# Patient Record
Sex: Female | Born: 1960 | Race: Black or African American | Hispanic: No | Smoking: Former smoker
Health system: Southern US, Community
[De-identification: ages and names within clinical notes are randomized; demographics above are authoritative.]

## PROBLEM LIST (undated history)

## (undated) DIAGNOSIS — I10 Essential (primary) hypertension: Secondary | ICD-10-CM

## (undated) DIAGNOSIS — M1990 Unspecified osteoarthritis, unspecified site: Secondary | ICD-10-CM

## (undated) DIAGNOSIS — T7840XA Allergy, unspecified, initial encounter: Secondary | ICD-10-CM

## (undated) HISTORY — DX: Unspecified osteoarthritis, unspecified site: M19.90

## (undated) HISTORY — PX: ABDOMINAL HYSTERECTOMY: SHX81

## (undated) HISTORY — DX: Allergy, unspecified, initial encounter: T78.40XA

---

## 2004-01-30 ENCOUNTER — Emergency Department (HOSPITAL_COMMUNITY): Admission: EM | Admit: 2004-01-30 | Discharge: 2004-01-30 | Payer: Self-pay | Admitting: Emergency Medicine

## 2004-02-06 ENCOUNTER — Ambulatory Visit: Payer: Self-pay | Admitting: Internal Medicine

## 2004-02-07 ENCOUNTER — Encounter: Admission: RE | Admit: 2004-02-07 | Discharge: 2004-02-07 | Payer: Self-pay | Admitting: Internal Medicine

## 2004-02-22 ENCOUNTER — Encounter: Admission: RE | Admit: 2004-02-22 | Discharge: 2004-03-08 | Payer: Self-pay | Admitting: Internal Medicine

## 2005-02-23 ENCOUNTER — Emergency Department (HOSPITAL_COMMUNITY): Admission: EM | Admit: 2005-02-23 | Discharge: 2005-02-23 | Payer: Self-pay | Admitting: Family Medicine

## 2005-03-01 ENCOUNTER — Emergency Department (HOSPITAL_COMMUNITY): Admission: EM | Admit: 2005-03-01 | Discharge: 2005-03-01 | Payer: Self-pay | Admitting: Family Medicine

## 2005-10-25 ENCOUNTER — Emergency Department (HOSPITAL_COMMUNITY): Admission: EM | Admit: 2005-10-25 | Discharge: 2005-10-25 | Payer: Self-pay | Admitting: Emergency Medicine

## 2005-11-08 ENCOUNTER — Ambulatory Visit: Payer: Self-pay | Admitting: Family Medicine

## 2005-11-29 ENCOUNTER — Encounter: Admission: RE | Admit: 2005-11-29 | Discharge: 2005-11-29 | Payer: Self-pay | Admitting: Sports Medicine

## 2005-12-09 ENCOUNTER — Ambulatory Visit: Payer: Self-pay | Admitting: Family Medicine

## 2006-02-20 ENCOUNTER — Ambulatory Visit: Payer: Self-pay | Admitting: Family Medicine

## 2006-04-10 ENCOUNTER — Ambulatory Visit: Payer: Self-pay | Admitting: Family Medicine

## 2006-04-17 DIAGNOSIS — F329 Major depressive disorder, single episode, unspecified: Secondary | ICD-10-CM

## 2006-04-17 DIAGNOSIS — I10 Essential (primary) hypertension: Secondary | ICD-10-CM

## 2006-04-17 DIAGNOSIS — F3289 Other specified depressive episodes: Secondary | ICD-10-CM | POA: Insufficient documentation

## 2006-04-17 DIAGNOSIS — M159 Polyosteoarthritis, unspecified: Secondary | ICD-10-CM | POA: Insufficient documentation

## 2006-04-17 DIAGNOSIS — E7800 Pure hypercholesterolemia, unspecified: Secondary | ICD-10-CM

## 2006-05-05 ENCOUNTER — Telehealth: Payer: Self-pay | Admitting: *Deleted

## 2006-06-13 ENCOUNTER — Telehealth (INDEPENDENT_AMBULATORY_CARE_PROVIDER_SITE_OTHER): Payer: Self-pay | Admitting: Family Medicine

## 2006-06-18 ENCOUNTER — Telehealth: Payer: Self-pay | Admitting: *Deleted

## 2006-06-27 ENCOUNTER — Encounter (INDEPENDENT_AMBULATORY_CARE_PROVIDER_SITE_OTHER): Payer: Self-pay | Admitting: Family Medicine

## 2006-06-27 ENCOUNTER — Ambulatory Visit: Payer: Self-pay | Admitting: Family Medicine

## 2006-06-27 DIAGNOSIS — M5412 Radiculopathy, cervical region: Secondary | ICD-10-CM | POA: Insufficient documentation

## 2006-06-27 LAB — CONVERTED CEMR LAB
BUN: 15 mg/dL (ref 6–23)
CO2: 25 meq/L (ref 19–32)
Calcium: 9.7 mg/dL (ref 8.4–10.5)
Chloride: 101 meq/L (ref 96–112)
Creatinine, Ser: 1.03 mg/dL (ref 0.40–1.20)
Glucose, Bld: 95 mg/dL (ref 70–99)
HDL goal, serum: 40 mg/dL
LDL Goal: 160 mg/dL
Potassium: 3.8 meq/L (ref 3.5–5.3)
Sodium: 137 meq/L (ref 135–145)

## 2006-07-08 ENCOUNTER — Telehealth (INDEPENDENT_AMBULATORY_CARE_PROVIDER_SITE_OTHER): Payer: Self-pay | Admitting: Family Medicine

## 2006-07-17 ENCOUNTER — Encounter (INDEPENDENT_AMBULATORY_CARE_PROVIDER_SITE_OTHER): Payer: Self-pay | Admitting: Family Medicine

## 2006-07-17 ENCOUNTER — Ambulatory Visit: Payer: Self-pay | Admitting: Family Medicine

## 2006-07-20 LAB — CONVERTED CEMR LAB: Direct LDL: 158 mg/dL — ABNORMAL HIGH

## 2006-07-30 ENCOUNTER — Ambulatory Visit: Payer: Self-pay | Admitting: Family Medicine

## 2006-07-30 DIAGNOSIS — G894 Chronic pain syndrome: Secondary | ICD-10-CM | POA: Insufficient documentation

## 2006-10-06 ENCOUNTER — Telehealth (INDEPENDENT_AMBULATORY_CARE_PROVIDER_SITE_OTHER): Payer: Self-pay | Admitting: *Deleted

## 2006-10-07 ENCOUNTER — Ambulatory Visit: Payer: Self-pay | Admitting: Family Medicine

## 2006-10-07 DIAGNOSIS — J309 Allergic rhinitis, unspecified: Secondary | ICD-10-CM | POA: Insufficient documentation

## 2006-10-28 ENCOUNTER — Telehealth (INDEPENDENT_AMBULATORY_CARE_PROVIDER_SITE_OTHER): Payer: Self-pay | Admitting: Family Medicine

## 2006-11-11 ENCOUNTER — Emergency Department (HOSPITAL_COMMUNITY): Admission: EM | Admit: 2006-11-11 | Discharge: 2006-11-11 | Payer: Self-pay | Admitting: Emergency Medicine

## 2006-11-24 ENCOUNTER — Encounter (INDEPENDENT_AMBULATORY_CARE_PROVIDER_SITE_OTHER): Payer: Self-pay | Admitting: Family Medicine

## 2006-11-24 ENCOUNTER — Ambulatory Visit: Payer: Self-pay | Admitting: Family Medicine

## 2006-11-24 DIAGNOSIS — E669 Obesity, unspecified: Secondary | ICD-10-CM

## 2006-11-24 DIAGNOSIS — N951 Menopausal and female climacteric states: Secondary | ICD-10-CM

## 2006-11-24 LAB — CONVERTED CEMR LAB
BUN: 15 mg/dL (ref 6–23)
CO2: 20 meq/L (ref 19–32)
Calcium: 9.4 mg/dL (ref 8.4–10.5)
Direct LDL: 142 mg/dL — ABNORMAL HIGH
Glucose, Bld: 89 mg/dL (ref 70–99)
Sodium: 140 meq/L (ref 135–145)

## 2006-11-26 ENCOUNTER — Encounter (INDEPENDENT_AMBULATORY_CARE_PROVIDER_SITE_OTHER): Payer: Self-pay | Admitting: Family Medicine

## 2006-12-12 ENCOUNTER — Ambulatory Visit: Payer: Self-pay | Admitting: Family Medicine

## 2006-12-12 DIAGNOSIS — G8929 Other chronic pain: Secondary | ICD-10-CM

## 2006-12-25 ENCOUNTER — Ambulatory Visit: Payer: Self-pay | Admitting: Family Medicine

## 2006-12-25 DIAGNOSIS — J019 Acute sinusitis, unspecified: Secondary | ICD-10-CM

## 2007-02-24 ENCOUNTER — Telehealth (INDEPENDENT_AMBULATORY_CARE_PROVIDER_SITE_OTHER): Payer: Self-pay | Admitting: Family Medicine

## 2007-03-20 ENCOUNTER — Telehealth: Payer: Self-pay | Admitting: *Deleted

## 2007-12-25 ENCOUNTER — Telehealth: Payer: Self-pay | Admitting: *Deleted

## 2007-12-25 ENCOUNTER — Ambulatory Visit: Payer: Self-pay | Admitting: Family Medicine

## 2008-01-06 ENCOUNTER — Encounter (INDEPENDENT_AMBULATORY_CARE_PROVIDER_SITE_OTHER): Payer: Self-pay | Admitting: *Deleted

## 2008-02-26 ENCOUNTER — Emergency Department (HOSPITAL_COMMUNITY): Admission: EM | Admit: 2008-02-26 | Discharge: 2008-02-26 | Payer: Self-pay | Admitting: Emergency Medicine

## 2008-03-02 ENCOUNTER — Encounter: Payer: Self-pay | Admitting: *Deleted

## 2008-03-02 ENCOUNTER — Emergency Department (HOSPITAL_COMMUNITY): Admission: EM | Admit: 2008-03-02 | Discharge: 2008-03-02 | Payer: Self-pay | Admitting: Family Medicine

## 2008-03-03 ENCOUNTER — Encounter: Payer: Self-pay | Admitting: *Deleted

## 2008-03-03 ENCOUNTER — Ambulatory Visit: Payer: Self-pay | Admitting: Family Medicine

## 2008-03-03 DIAGNOSIS — Z9189 Other specified personal risk factors, not elsewhere classified: Secondary | ICD-10-CM | POA: Insufficient documentation

## 2008-03-11 ENCOUNTER — Encounter: Payer: Self-pay | Admitting: Family Medicine

## 2008-04-18 ENCOUNTER — Telehealth (INDEPENDENT_AMBULATORY_CARE_PROVIDER_SITE_OTHER): Payer: Self-pay | Admitting: *Deleted

## 2008-06-23 ENCOUNTER — Ambulatory Visit: Payer: Self-pay | Admitting: Family Medicine

## 2008-06-23 ENCOUNTER — Telehealth (INDEPENDENT_AMBULATORY_CARE_PROVIDER_SITE_OTHER): Payer: Self-pay | Admitting: *Deleted

## 2008-06-28 ENCOUNTER — Telehealth: Payer: Self-pay | Admitting: Family Medicine

## 2008-06-29 ENCOUNTER — Ambulatory Visit: Payer: Self-pay | Admitting: Family Medicine

## 2008-06-29 ENCOUNTER — Encounter: Payer: Self-pay | Admitting: Family Medicine

## 2008-06-29 DIAGNOSIS — M545 Low back pain: Secondary | ICD-10-CM

## 2008-07-22 ENCOUNTER — Ambulatory Visit: Payer: Self-pay | Admitting: Family Medicine

## 2008-08-11 ENCOUNTER — Telehealth: Payer: Self-pay | Admitting: Family Medicine

## 2008-08-11 ENCOUNTER — Ambulatory Visit: Payer: Self-pay | Admitting: Family Medicine

## 2008-10-24 ENCOUNTER — Encounter (INDEPENDENT_AMBULATORY_CARE_PROVIDER_SITE_OTHER): Payer: Self-pay | Admitting: *Deleted

## 2008-10-24 DIAGNOSIS — F17200 Nicotine dependence, unspecified, uncomplicated: Secondary | ICD-10-CM

## 2009-03-31 ENCOUNTER — Telehealth: Payer: Self-pay | Admitting: Family Medicine

## 2009-03-31 ENCOUNTER — Encounter: Payer: Self-pay | Admitting: Family Medicine

## 2009-03-31 ENCOUNTER — Ambulatory Visit: Payer: Self-pay | Admitting: Family Medicine

## 2009-04-03 LAB — CONVERTED CEMR LAB
Hemoglobin: 12.1 g/dL (ref 12.0–15.0)
MCHC: 32.1 g/dL (ref 30.0–36.0)
MCV: 76.5 fL — ABNORMAL LOW (ref 78.0–100.0)
Platelets: 393 10*3/uL (ref 150–400)
RDW: 15.7 % — ABNORMAL HIGH (ref 11.5–15.5)
Vit D, 25-Hydroxy: <10 ng/mL — ABNORMAL LOW (ref 30–89)

## 2009-05-13 IMAGING — CR DG KNEE COMPLETE 4+V*L*
4 series · 4 of 4 positions shown · non-contrast
Comparison: None

CLINICAL DATA: History of injury and pain.

LEFT KNEE - COMPLETE 4+ VIEW

[view not recorded (1 of 4)]
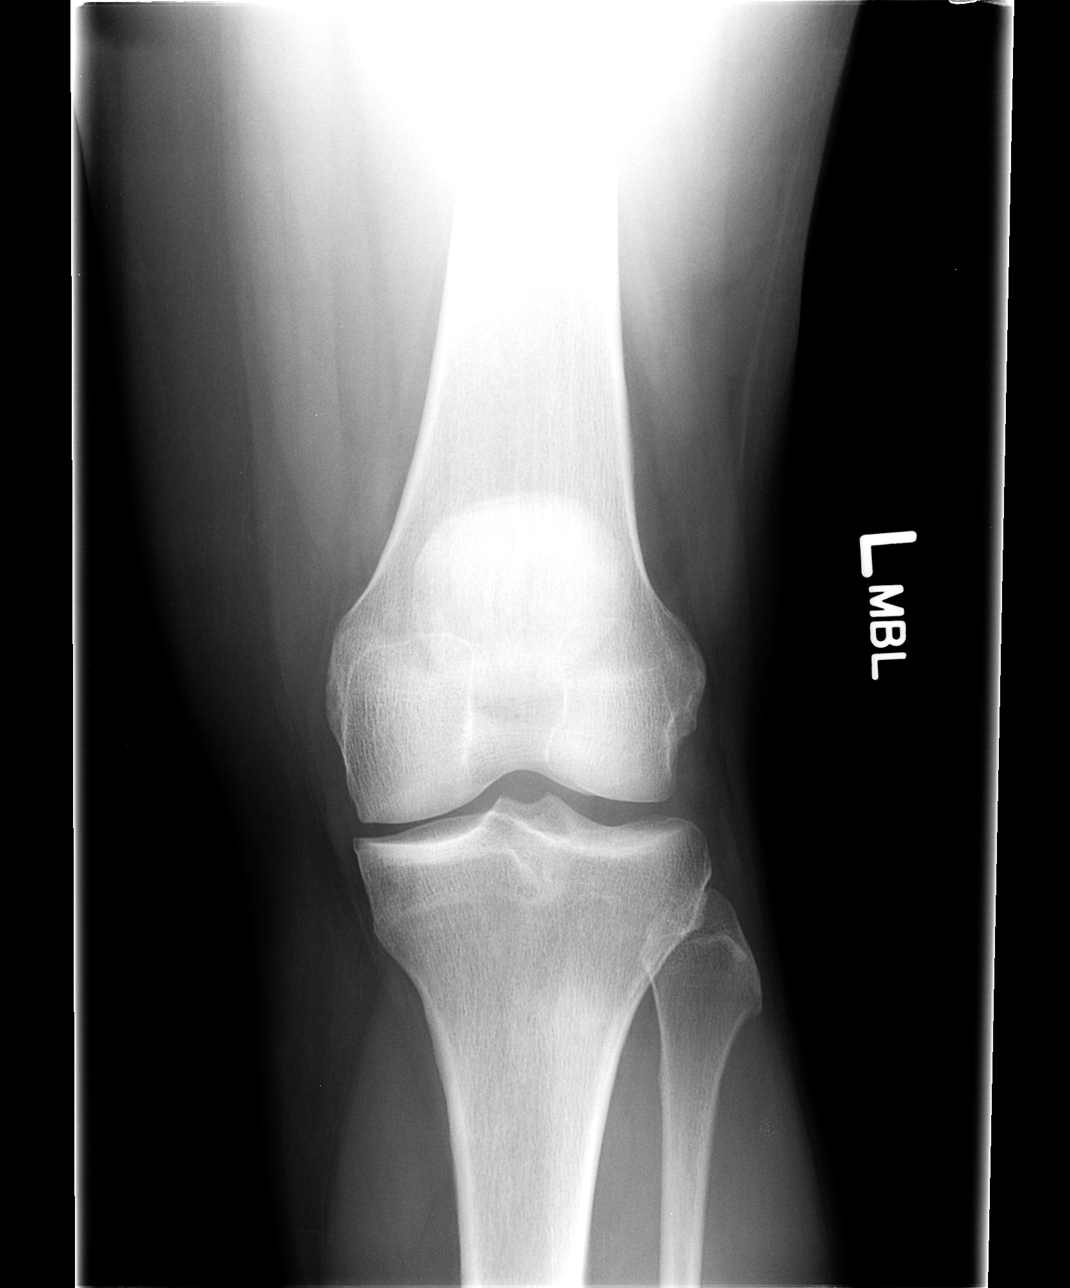

[view not recorded (2 of 4)]
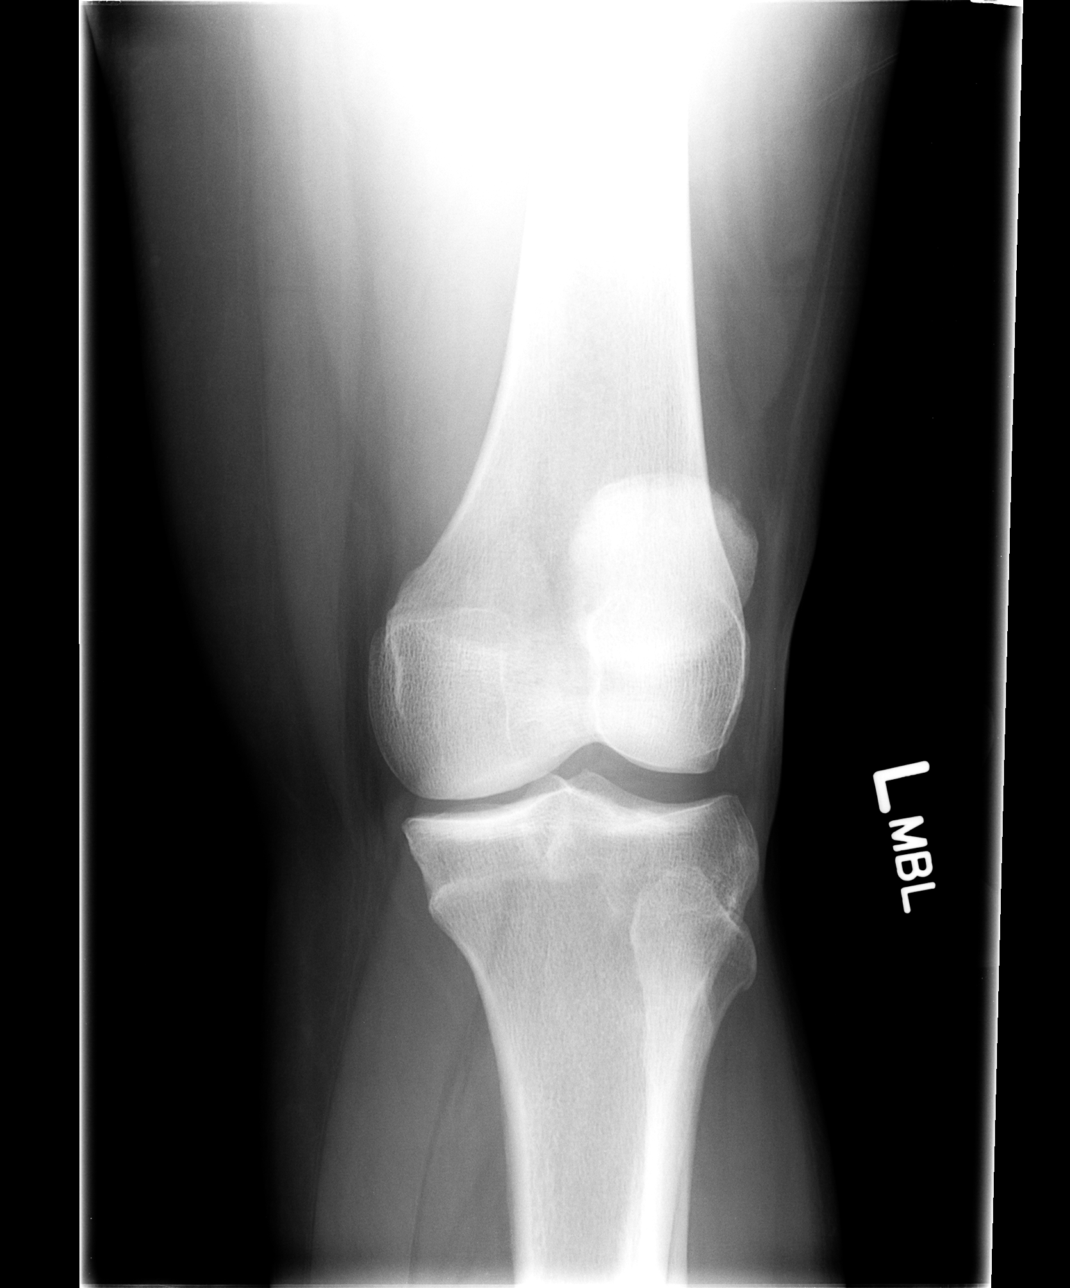

[view not recorded (3 of 4)]
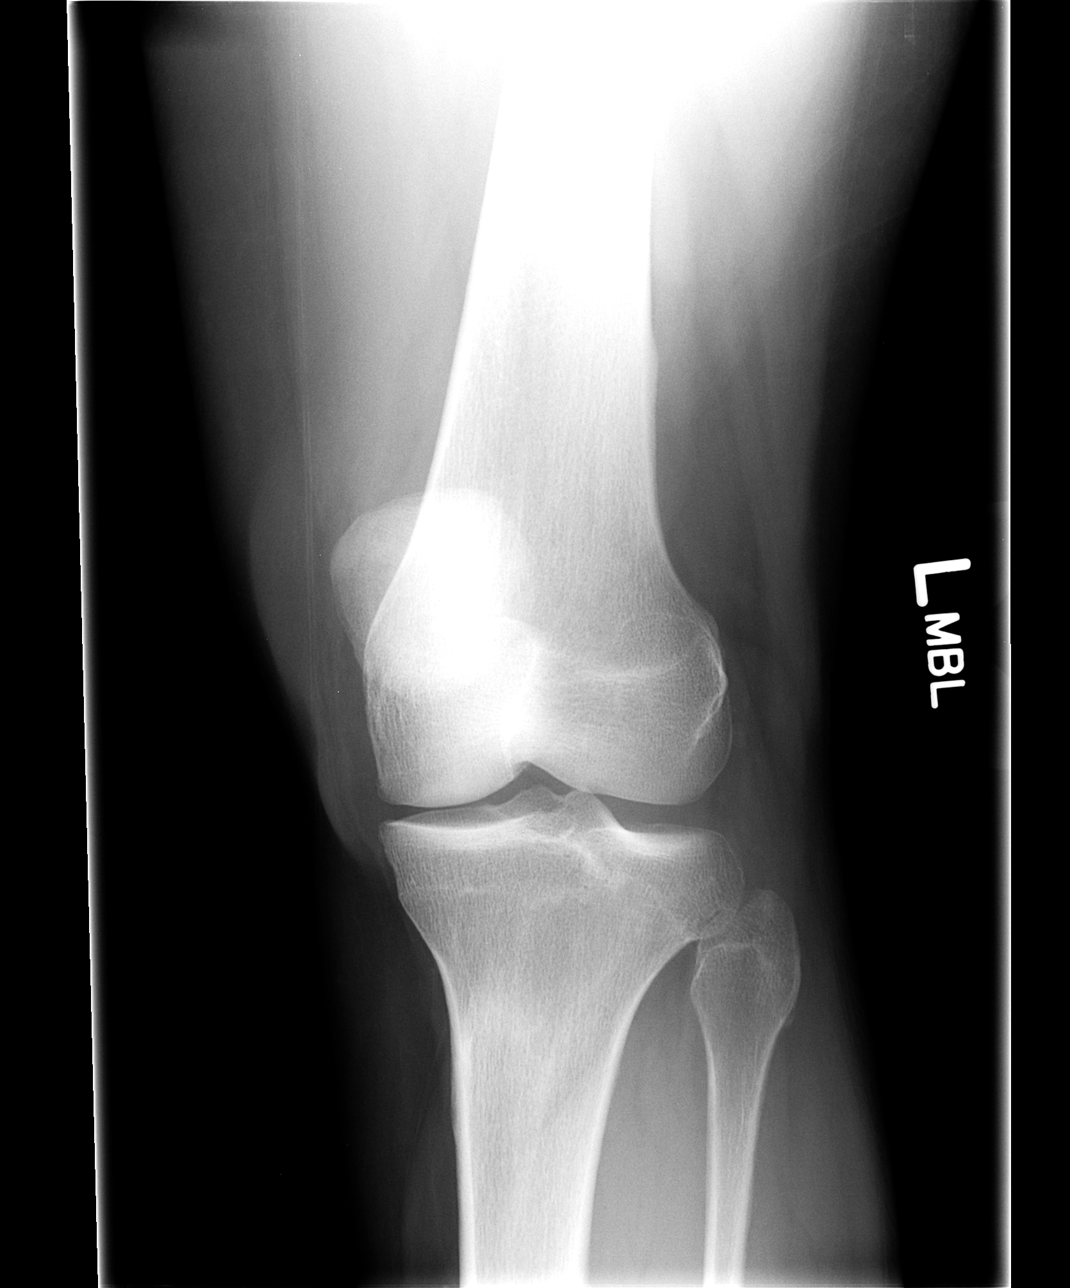

[view not recorded (4 of 4)]
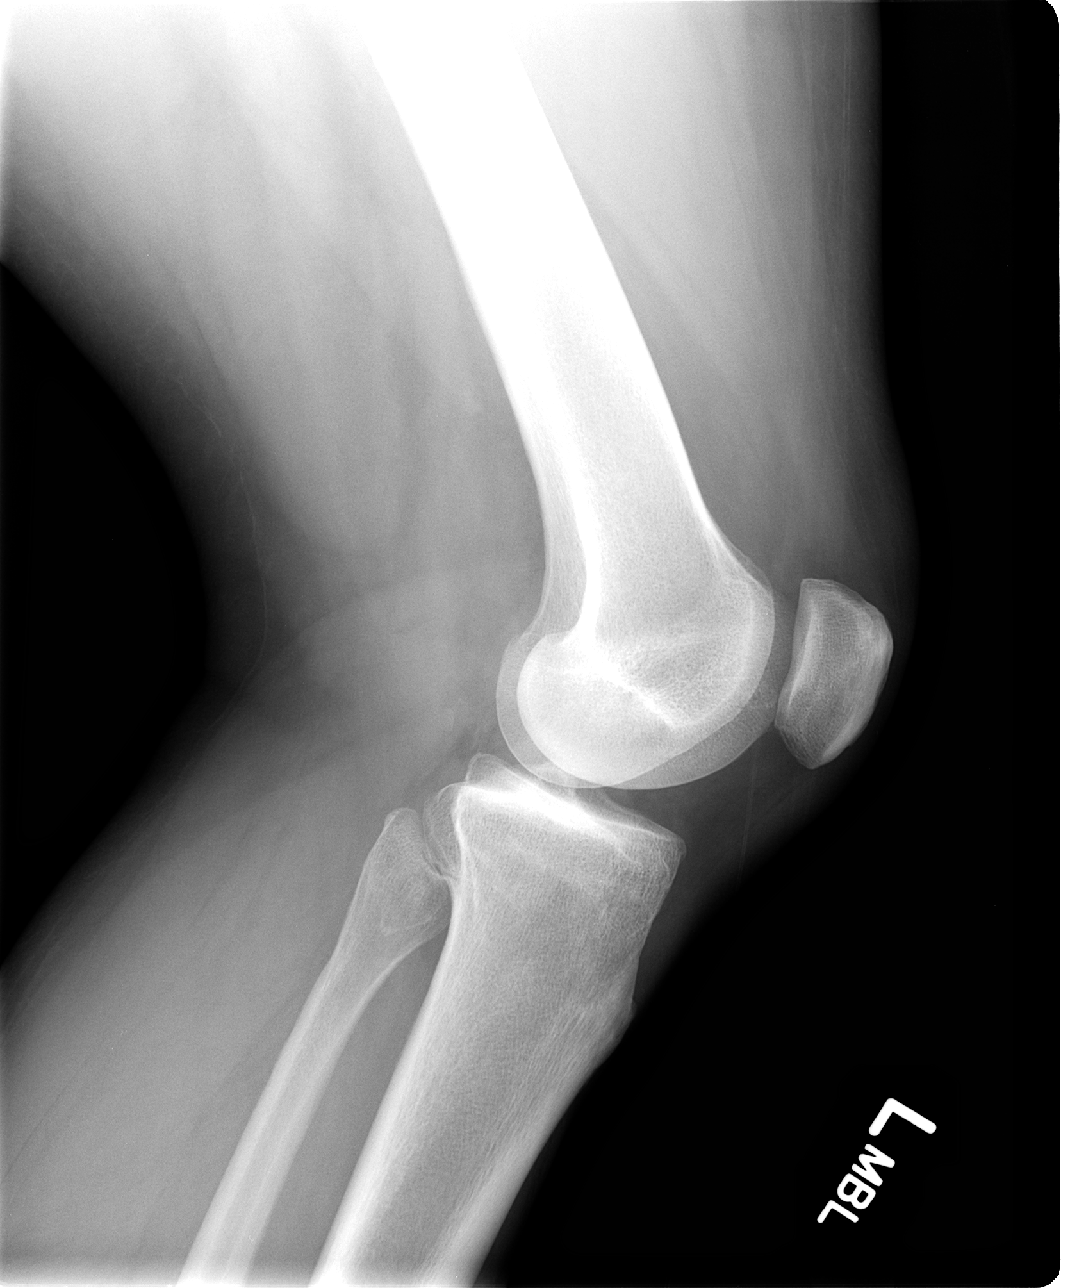

[4 of 4 positions shown; findings below may reference images not displayed]

FINDINGS: There is slight narrowing of the medial joint space.  No
fracture, bone destruction or erosion, or chondrocalcinosis is
seen.
IMPRESSION: Slight narrowing of the medial joint space.
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
## 2009-07-25 ENCOUNTER — Ambulatory Visit: Payer: Self-pay | Admitting: Family Medicine

## 2009-07-25 ENCOUNTER — Telehealth: Payer: Self-pay | Admitting: Family Medicine

## 2009-07-25 DIAGNOSIS — H25019 Cortical age-related cataract, unspecified eye: Secondary | ICD-10-CM

## 2009-07-27 ENCOUNTER — Encounter: Payer: Self-pay | Admitting: Family Medicine

## 2009-08-17 ENCOUNTER — Telehealth: Payer: Self-pay | Admitting: Family Medicine

## 2009-11-15 ENCOUNTER — Emergency Department (HOSPITAL_COMMUNITY): Admission: EM | Admit: 2009-11-15 | Discharge: 2009-11-15 | Payer: Self-pay | Admitting: Family Medicine

## 2009-11-20 ENCOUNTER — Telehealth: Payer: Self-pay | Admitting: Family Medicine

## 2010-02-08 ENCOUNTER — Emergency Department (HOSPITAL_COMMUNITY)
Admission: EM | Admit: 2010-02-08 | Discharge: 2010-02-08 | Payer: Self-pay | Source: Home / Self Care | Admitting: Family Medicine

## 2010-03-22 NOTE — Progress Notes (Signed)
Summary: triage  Phone Note Call from Patient Call back at Home Phone [phone]   Caller: Patient Summary of Call: having really bad hot flashes and VERY emotional and wants to come in today! Initial call taken by: De Nurse,  March 31, 2009 9:07 AM  Follow-up for Phone Call        she is at work & wants to be seen asap. she is going to check with her job to see if she can come now or take the 3pm workin. she will call back Follow-up by: Golden Circle RN,  March 31, 2009 9:22 AM  Additional Follow-up for Phone Call Additional follow up Details #1::        she will be here at 3 for a work in. aware of wait. wants something for the hot flashes & emotional fluctuations Additional Follow-up by: Golden Circle RN,  March 31, 2009 9:30 AM    Additional Follow-up for Phone Call Additional follow up Details #2::    Will follow up clinic visit from today if patient shows  Follow-up by: Bobby Rumpf  MD,  March 31, 2009 12:09 PM
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
## 2010-03-22 NOTE — Miscellaneous (Signed)
Summary: Re: ophthalmology  referral  Clinical Lists Changes  spoke with patient and she currently has no insurance . she does plan to see Rudell Cobb to see if she qualifies. she will contact us when she has the orange card . she  does not want to schedule appointment with ophthalmologist now and be self pay . will schedule when she brings in card.  Theresia Lo RN  July 27, 2009 8:37 AM   Thanks for the update.  Will plan for follow-up once she obtains [employer]. Renold Don MD  July 27, 2009 2:21 PM   Appended Document: Re: ophthalmology  referral called patient and she is waiting for Gavin Pound to call her  to set up an appointment.

## 2010-03-22 NOTE — Assessment & Plan Note (Signed)
Summary: hot flashes/[place]/Carew   Vital Signs:  Patient profile:   50 year old female Weight:      218 pounds Temp:     98.2 degrees F Pulse rate:   87 / minute BP sitting:   153 / 92  Vitals Entered By: Jone Baseman CMA (March 31, 2009 3:31 PM) CC: hot flashes and emotional Is Patient Diabetic? No Pain Assessment Patient in pain? no        Primary Care Provider:  Bobby Rumpf  MD  CC:  hot flashes and emotional.  History of Present Illness: 1. hot flashes s/p hysterectomy around 1990-07-26 -- thinks they took out uterus and ovaries. Was on a hormone patch soon after the surgery but that was d/c'd years ago. Over the past 5-6 months has noticed increasing sweats, hot flashes, increased emotional lability. has dry mouth and dry eyes; no vaginal dryness, however. Would like to be put back on "hormones." Smokes about 3 cigs/day. Tried what she thinks were phytoestrogens from an herbal store that worked initially but have since worn off. Denies depression, SI/HI.  Habits & Providers  Alcohol-Tobacco-Diet     Tobacco Status: current     Tobacco Counseling: to quit use of tobacco products     Cigarette Packs/Day: 3 cigs a day  Current Medications (verified): 1)  Bl Allergy Relief 10 Mg  Tbdp (Loratadine) .Marland Kitchen.. 1 Tabelt Every Day For Allergies 2)  Albuterol 90 Mcg/act Aers (Albuterol) .... Inhale 2 Puff Using Inhaler Every Four  To Six Hours 3)  Flexeril 5 Mg Tabs (Cyclobenzaprine Hcl) .... Take 1 Tablet By Mouth At Bedtime 4)  Hydrochlorothiazide 25 Mg Tabs (Hydrochlorothiazide) .... Take 1 Tablet By Mouth Once A Day 5)  Paxil 20 Mg Tabs (Paroxetine Hcl) .... One By Mouth Daily For Menopausal Symptoms  Allergies: 1)  ! Lisinopril 2)  ! Penicillin  Past History:  Past Surgical History: Hysterectomy, BSO - about 29  Family History: osteoarthrits MGM Paternal aunt had breast cancer - diagnosed around age 42.   Social History: Widow. Husband passed away in Jul 26, 1995. 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  Smokes 1ppd x 20 years, now down to 3 cigarettes as day. Son born in 07-25-77, [place].Packs/Day:  3 cigs a day  Review of Systems  The patient denies anorexia, fever, chest pain, syncope, and dyspnea on exertion.         no bowel or bladder problems  Physical Exam  Additional Exam:  General:  Vital signs reviewed -- overweight, hypertensive Alert, appropriate; well-dressed and well-nourished Neck: no carotid bruits, no JVD, no tenderness or masses  Lungs:  work of breathing unlabored, clear to auscultation bilaterally; no wheezes, rales, or ronchi; good air movement throughout Heart:  regular rate and rhythm, no murmurs; normal s1/s2 Pulses:  DP and radial pulses 2+ bilaterally  Extremities:  no cyanosis, clubbing, or edema Neurologic:  alert and oriented. speech normal. Skin: warm, moist, not diaphoretic    Impression & Recommendations:  Problem # 1:  HOT FLASHES (ICD-627.2) Assessment Deteriorated Seen for this in 26-Jul-2006. I'm not sure why these would be manifesting now given the remote history of TAH/BSO. Discussed risk and benifit of HRT and poor candidacy and VTE risk because of smoking. Will try venlafaxine for symtom control. Pt has agreed to 1 month trial of this and follow-up with Dr. Wallene Huh. Labs as below.  Orders: CBC-[facility] ([address]) TSH-[facility] [phone]) Vit D, 25 [facility] ([phone]) [facility]- Est  Level 4 (78469)  *  please note that pt does not have insurance and pharmacy called saying that she could not afford venlafaxin. Paxil CR has some evidence but also expensive; therefore will try paxil 20 mg daily (regular release) to see if this helps. If symptoms still unmanageable would consider HRT with good documentation if pt is accepting of risks including increased cancer, CAD/stroke, and VTE.   Complete Medication List: 1)  Bl Allergy Relief 10 Mg Tbdp (Loratadine) .Marland Kitchen.. 1 tabelt every day for allergies 2)  Albuterol 90 Mcg/act Aers  (Albuterol) .... Inhale 2 puff using inhaler every four  to six hours 3)  Flexeril 5 Mg Tabs (Cyclobenzaprine hcl) .... Take 1 tablet by mouth at bedtime 4)  Hydrochlorothiazide 25 Mg Tabs (Hydrochlorothiazide) .... Take 1 tablet by mouth once a day 5)  Paxil 20 Mg Tabs (Paroxetine hcl) .... One by mouth daily for menopausal symptoms  Patient Instructions: 1)  start the venlafaxine once a day. This may take some time to start to work.  2)  follow-up with Dr. Wallene Huh in 2-3 weeks.  3)  Stop smoking -- this is important if we need to consider the use of hormones.  4)  Very nice to meet you today. Prescriptions: PAXIL 20 MG TABS (PAROXETINE HCL) one by mouth daily for menopausal symptoms  #30 x 2   Entered and Authorized by:   Myrtie Soman  MD   Signed by:   Myrtie Soman  MD on 03/31/2009   Method used:   Electronically to        [facility] (retail)       [address]       [place], [place]  [address]       Ph: [phone]       Fax: [phone]   RxID:   [id number] ALBUTEROL 90 MCG/ACT AERS (ALBUTEROL) Inhale 2 puff using inhaler every four  to six hours  #1 x 0   Entered and Authorized by:   Myrtie Soman  MD   Signed by:   Myrtie Soman  MD on 03/31/2009   Method used:   Electronically to        [facility] (retail)       [address]       [place], [place]  [address]       Ph: [phone]       Fax: [phone]   RxID:   [id number] FLEXERIL 5 MG TABS (CYCLOBENZAPRINE HCL) Take 1 tablet by mouth at bedtime  #30 x 3   Entered and Authorized by:   Myrtie Soman  MD   Signed by:   Myrtie Soman  MD on 03/31/2009   Method used:   Electronically to        [facility] (retail)       [address]       [place], [place]  [address]       Ph: [phone]       Fax: [phone]   RxID:   [id number] VENLAFAXINE HCL 75 MG XR24H-TAB (VENLAFAXINE HCL) one by mouth daily  for menopausal symptoms  #30 x 1   Entered and Authorized by:   Myrtie Soman  MD   Signed by:   Myrtie Soman  MD on 03/31/2009   Method used:   [facility]ronically to  [facility]       [address]       [place], [place]  [address]       Ph: [phone]       Fax: [phone]   RxID:   [id number]

## 2010-03-22 NOTE — Progress Notes (Signed)
Summary: Rx Req  Phone Note Refill Request Call back at [facility] Phone [phone] Message from:  Patient  Refills Requested: Medication #1:  FLEXERIL 5 MG TABS Take 1 tablet by mouth at bedtime [facility].  Initial call taken by: Clydell Hakim,  November 20, 2009 2:35 PM    Prescriptions: FLEXERIL 5 MG TABS (CYCLOBENZAPRINE HCL) Take 1 tablet by mouth at bedtime  #30 x 0   Entered and Authorized by:   Bobby Rumpf  MD   Signed by:   Bobby Rumpf  MD on 11/20/2009   Method used:   Electronically to        [facility] (retail)       [address]       [place], [place]  [address]       Ph: [phone]       Fax: [phone]   RxID:   [id number]  Please let know that script is at pharmacy for pickup. Thanks! Lavetta Nielsen  MD  November 20, 2009 3:59 PM   Appended Document: Rx Req Pt notified rx sent in.

## 2010-03-22 NOTE — Progress Notes (Signed)
Summary: Rx Req  Phone Note Refill Request Call back at Home Phone [phone] Message from:  Patient  Refills Requested: Medication #1:  HYDROCHLOROTHIAZIDE 25 MG TABS Take 1 tablet by mouth once a day [facility] PT IS OUT.  Initial call taken by: Clydell Hakim,  November 20, 2009 2:33 PM  Follow-up for Phone Call       Follow-up by: Golden Circle RN,  November 20, 2009 2:35 PM    Prescriptions: HYDROCHLOROTHIAZIDE 25 MG TABS (HYDROCHLOROTHIAZIDE) Take 1 tablet by mouth once a day  #30 x 6   Entered and Authorized by:   Bobby Rumpf  MD   Signed by:   Bobby Rumpf  MD on 11/20/2009   Method used:   Electronically to        [facility] (retail)       [address]       [place], [place]  [address]       Ph: [phone]       Fax: [phone]   RxID:   [id number]

## 2010-03-22 NOTE — Progress Notes (Signed)
Summary: triage  Phone Note Call from Patient Call back at Home Phone [phone]   Caller: Patient Summary of Call: has a sinus inf and wants something called in Initial call taken by: De Nurse,  July 25, 2009 2:14 PM  Follow-up for Phone Call        told her she must be seen. she is on her way now for a work in appt at 3 with Dr. Gwendolyn Grant Follow-up by: Golden Circle RN,  July 25, 2009 2:32 PM  Additional Follow-up for Phone Call Additional follow up Details #1::        Reviewed Dr. Tyson Alias clinic note Additional Follow-up by: Bobby Rumpf  MD,  August 01, 2009 8:44 AM

## 2010-03-22 NOTE — Progress Notes (Signed)
Summary: Rx Prob  Phone Note Call from Patient Call back at Home Phone [phone]   Caller: Patient Summary of Call: Pt calling about her inhaler saying [facility]) has been faxing [place] concerning the need to change it.  Pt says she really needs this now. Initial call taken by: Clydell Hakim,  August 17, 2009 10:03 AM  Follow-up for Phone Call        called pharmacy and they actually need a new rx for albuterol . they no longer are able to get Relion inhaler and because of this they need a new Rx for albuterol inhaler. Dr. Leveda Anna advises OK to refill.  message left on voicemail that rx has been sent. Follow-up by: Theresia Lo RN,  August 17, 2009 10:39 AM

## 2010-04-05 ENCOUNTER — Other Ambulatory Visit: Payer: Self-pay | Admitting: Obstetrics and Gynecology

## 2010-04-05 DIAGNOSIS — Z1231 Encounter for screening mammogram for malignant neoplasm of breast: Secondary | ICD-10-CM

## 2010-04-05 DIAGNOSIS — N951 Menopausal and female climacteric states: Secondary | ICD-10-CM

## 2010-04-12 ENCOUNTER — Encounter: Payer: Self-pay | Admitting: *Deleted

## 2010-04-12 ENCOUNTER — Telehealth: Payer: Self-pay | Admitting: *Deleted

## 2010-04-12 MED ORDER — CYCLOBENZAPRINE HCL 5 MG PO TABS: 5.0000 mg | ORAL_TABLET | Freq: Every evening | ORAL | Status: DC | PRN

## 2010-04-12 NOTE — Telephone Encounter (Signed)
This encounter was created in error - please disregard.

## 2010-04-20 ENCOUNTER — Ambulatory Visit: Payer: Self-pay | Admitting: Internal Medicine

## 2010-04-20 DIAGNOSIS — Z0289 Encounter for other administrative examinations: Secondary | ICD-10-CM

## 2010-04-26 ENCOUNTER — Ambulatory Visit: Payer: Self-pay

## 2010-04-26 ENCOUNTER — Other Ambulatory Visit: Payer: Self-pay

## 2010-05-04 ENCOUNTER — Ambulatory Visit: Payer: Self-pay

## 2010-05-04 ENCOUNTER — Other Ambulatory Visit: Payer: Self-pay

## 2010-05-18 ENCOUNTER — Inpatient Hospital Stay: Admission: RE | Admit: 2010-05-18 | Payer: Self-pay | Source: Ambulatory Visit

## 2010-05-18 ENCOUNTER — Other Ambulatory Visit: Payer: Self-pay

## 2011-07-08 ENCOUNTER — Ambulatory Visit (INDEPENDENT_AMBULATORY_CARE_PROVIDER_SITE_OTHER): Payer: Self-pay | Admitting: Internal Medicine

## 2011-07-08 ENCOUNTER — Encounter: Payer: Self-pay | Admitting: Internal Medicine

## 2011-07-08 VITALS — BP 142/90 | HR 75 | Temp 98.1°F | Ht 65.0 in | Wt 220.8 lb

## 2011-07-08 DIAGNOSIS — M545 Low back pain: Secondary | ICD-10-CM

## 2011-07-08 DIAGNOSIS — G8929 Other chronic pain: Secondary | ICD-10-CM

## 2011-07-08 MED ORDER — AMITRIPTYLINE HCL 10 MG PO TABS: 10.0000 mg | ORAL_TABLET | Freq: Every day | ORAL | Status: DC

## 2011-07-08 MED ORDER — CYCLOBENZAPRINE HCL 5 MG PO TABS: 5.0000 mg | ORAL_TABLET | Freq: Every evening | ORAL | Status: DC | PRN

## 2011-07-08 MED ORDER — DICLOFENAC SODIUM 75 MG PO TBEC: 75.0000 mg | DELAYED_RELEASE_TABLET | Freq: Two times a day (BID) | ORAL | Status: AC
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
## 2011-07-08 NOTE — Patient Instructions (Signed)
It was good to see you today. Test(s) ordered today. Your results will be called to you after review (48-72hours after test completion). If any changes need to be made, you will be notified at that time. Start diclofenac 2x.day for pain, continue flexeril for muscle relaxer and start amitriptyline at night for pain and sleep - Your prescription(s) have been submitted to your pharmacy. Please take as directed and contact our office if you believe you are having problem(s) with the medication(s). Please schedule followup in 6-8 weeks for recheck, call sooner if problems.

## 2011-07-18 ENCOUNTER — Other Ambulatory Visit (HOSPITAL_COMMUNITY): Payer: Self-pay | Admitting: Sports Medicine

## 2011-07-18 DIAGNOSIS — M549 Dorsalgia, unspecified: Secondary | ICD-10-CM

## 2011-08-16 ENCOUNTER — Inpatient Hospital Stay (HOSPITAL_COMMUNITY)
Admission: RE | Admit: 2011-08-16 | Discharge: 2011-08-16 | Payer: Self-pay | Source: Ambulatory Visit | Attending: Sports Medicine | Admitting: Sports Medicine

## 2011-12-16 ENCOUNTER — Encounter (HOSPITAL_COMMUNITY): Payer: Self-pay

## 2011-12-16 ENCOUNTER — Ambulatory Visit (HOSPITAL_COMMUNITY)
Admission: RE | Admit: 2011-12-16 | Discharge: 2011-12-16 | Disposition: A | Discharge status: Home / Self Care | Payer: Self-pay | Source: Ambulatory Visit | Attending: Obstetrics and Gynecology | Admitting: Obstetrics and Gynecology

## 2011-12-16 VITALS — BP 110/70 | Temp 98.0°F | Ht 66.0 in | Wt 218.6 lb

## 2011-12-16 DIAGNOSIS — N6325 Unspecified lump in the left breast, overlapping quadrants: Secondary | ICD-10-CM | POA: Insufficient documentation

## 2011-12-16 DIAGNOSIS — Z1239 Encounter for other screening for malignant neoplasm of breast: Secondary | ICD-10-CM

## 2011-12-16 HISTORY — DX: Essential (primary) hypertension: I10

## 2011-12-16 NOTE — Progress Notes (Signed)
Patient referred to [facility] from [facility] due to recommendation of left breast biopsy. Screening mammogram completed 12/06/11 and follow up left breast diagnostic mammogram 12/12/11.  Pap Smear:    Pap smear not performed today. Patient has a history of a hysterectomy 21 years ago for fibroids. Per patient no history of abnormal Pap smears. No Pap smear results in EPIC.  Physical exam: Breasts Breasts symmetrical. No skin abnormalities bilateral breasts. No nipple retraction bilateral breasts. No nipple discharge bilateral breasts. No lymphadenopathy. No lumps palpated right breast. Palpated lump in the left breast at 6 o'clock around 12 cm from the nipple. Patient complained of tenderness when palpated lump. Patient referred to [facility] for left breat biopsy per recommendation. Appointment scheduled for Thursday, December 19, 2011 at 1430.      Pelvic/Bimanual No Pap smear completed today since patient has a history of a hysterectomy for benign reasons. Pap smear not indicated per [facility] guidelines.

## 2011-12-16 NOTE — Patient Instructions (Signed)
Taught patient how to perform BSE and gave educational materials to take home. Patient did not need a Pap smear today due to a history of a hysterectomy for benign reasons 21 years ago. Let patient know that she will not need any further pap smears due to her history of a hysterectomy for benign reasons.Patient referred to [facility] for left breat biopsy per recommendation. Appointment scheduled for Thursday, December 19, 2011 at 1430. Patient aware of appointment and will be there. Let patient know will follow up with her within the next couple weeks with results. Patient verbalized understanding.

## 2011-12-16 NOTE — Assessment & Plan Note (Signed)
Patient referred to [facility] for left breat biopsy per recommendation. Appointment scheduled for Thursday, December 19, 2011 at 1430.

## 2012-01-07 ENCOUNTER — Emergency Department (INDEPENDENT_AMBULATORY_CARE_PROVIDER_SITE_OTHER)
Admission: EM | Admit: 2012-01-07 | Discharge: 2012-01-07 | Disposition: A | Discharge status: Home / Self Care | Payer: Self-pay | Source: Home / Self Care | Attending: Family Medicine | Admitting: Family Medicine

## 2012-01-07 ENCOUNTER — Encounter (HOSPITAL_COMMUNITY): Payer: Self-pay | Admitting: *Deleted

## 2012-01-07 DIAGNOSIS — G8929 Other chronic pain: Secondary | ICD-10-CM

## 2012-01-07 DIAGNOSIS — M549 Dorsalgia, unspecified: Secondary | ICD-10-CM

## 2012-01-07 MED ORDER — OXYCODONE-ACETAMINOPHEN 7.5-325 MG PO TABS: 1.0000 | ORAL_TABLET | Freq: Two times a day (BID) | ORAL | Status: DC

## 2012-01-07 NOTE — ED Notes (Signed)
Pt  Reports     She  Ran out     Of  Her  Pain meds  Yesterday      -  [place]  [place]  [place]  [place]                She  Reports     That  She  Has  Chronic  Back  Pain
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
## 2012-02-20 NOTE — Addendum Note (Signed)
Encounter addended by: Saintclair Halsted, RN on: 02/20/2012  3:41 PM<BR>     Documentation filed: Visit Diagnoses, Charges VN

## 2012-07-08 ENCOUNTER — Telehealth (HOSPITAL_COMMUNITY): Payer: Self-pay | Admitting: *Deleted

## 2012-07-08 NOTE — Telephone Encounter (Signed)
Telephoned patient at home # and left message on voicemail. Telephoned patient's work number unable to speak to patient at work.

## 2012-08-10 ENCOUNTER — Emergency Department (HOSPITAL_COMMUNITY)
Admission: EM | Admit: 2012-08-10 | Discharge: 2012-08-10 | Disposition: A | Discharge status: Home / Self Care | Payer: Self-pay | Attending: Emergency Medicine | Admitting: Emergency Medicine

## 2012-08-10 ENCOUNTER — Encounter (HOSPITAL_COMMUNITY): Payer: Self-pay | Admitting: Emergency Medicine

## 2012-08-10 DIAGNOSIS — Z87891 Personal history of nicotine dependence: Secondary | ICD-10-CM | POA: Insufficient documentation

## 2012-08-10 DIAGNOSIS — Z880 Allergy status to penicillin: Secondary | ICD-10-CM | POA: Insufficient documentation

## 2012-08-10 DIAGNOSIS — R109 Unspecified abdominal pain: Secondary | ICD-10-CM | POA: Insufficient documentation

## 2012-08-10 DIAGNOSIS — Z79899 Other long term (current) drug therapy: Secondary | ICD-10-CM | POA: Insufficient documentation

## 2012-08-10 DIAGNOSIS — I10 Essential (primary) hypertension: Secondary | ICD-10-CM | POA: Insufficient documentation

## 2012-08-10 DIAGNOSIS — M159 Polyosteoarthritis, unspecified: Secondary | ICD-10-CM | POA: Insufficient documentation

## 2012-08-10 LAB — COMPREHENSIVE METABOLIC PANEL
ALT: 27 U/L (ref 0–35)
AST: 18 U/L (ref 0–37)
Albumin: 3.8 g/dL (ref 3.5–5.2)
Alkaline Phosphatase: 116 U/L (ref 39–117)
CO2: 28 mEq/L (ref 19–32)
Calcium: 9.4 mg/dL (ref 8.4–10.5)
Chloride: 97 mEq/L (ref 96–112)
Creatinine, Ser: 1.06 mg/dL (ref 0.50–1.10)
GFR calc Af Amer: 69 mL/min — ABNORMAL LOW (ref >90)
GFR calc non Af Amer: 60 mL/min — ABNORMAL LOW (ref >90)
Glucose, Bld: 102 mg/dL — ABNORMAL HIGH (ref 70–99)
Potassium: 3.7 mEq/L (ref 3.5–5.1)
Sodium: 135 mEq/L (ref 135–145)
Total Bilirubin: 0.5 mg/dL (ref 0.3–1.2)
Total Protein: 8.2 g/dL (ref 6.0–8.3)

## 2012-08-10 LAB — URINALYSIS, ROUTINE W REFLEX MICROSCOPIC
Bilirubin Urine: NEGATIVE
Glucose, UA: NEGATIVE mg/dL
Hgb urine dipstick: NEGATIVE
Nitrite: NEGATIVE
Protein, ur: NEGATIVE mg/dL
Specific Gravity, Urine: 1.018 (ref 1.005–1.030)
Urobilinogen, UA: 0.2 mg/dL (ref 0.0–1.0)
pH: 5 (ref 5.0–8.0)

## 2012-08-10 LAB — CBC WITH DIFFERENTIAL/PLATELET
Basophils Absolute: 0 10*3/uL (ref 0.0–0.1)
Eosinophils Relative: 1 % (ref 0–5)
HCT: 36.4 % (ref 36.0–46.0)
Hemoglobin: 12.1 g/dL (ref 12.0–15.0)
Lymphocytes Relative: 23 % (ref 12–46)
Lymphs Abs: 2.9 10*3/uL (ref 0.7–4.0)
MCH: 24.6 pg — ABNORMAL LOW (ref 26.0–34.0)
MCV: 74.1 fL — ABNORMAL LOW (ref 78.0–100.0)
Monocytes Absolute: 1.1 10*3/uL — ABNORMAL HIGH (ref 0.1–1.0)
Monocytes Relative: 9 % (ref 3–12)
Neutro Abs: 8.8 10*3/uL — ABNORMAL HIGH (ref 1.7–7.7)
Neutrophils Relative %: 68 % (ref 43–77)
RBC: 4.91 MIL/uL (ref 3.87–5.11)
RDW: 15.1 % (ref 11.5–15.5)
WBC: 12.9 10*3/uL — ABNORMAL HIGH (ref 4.0–10.5)

## 2012-08-10 LAB — URINE MICROSCOPIC-ADD ON

## 2012-08-10 LAB — TROPONIN I: Troponin I: <0.3 ng/mL (ref <0.30)

## 2012-08-10 LAB — LIPASE, BLOOD: Lipase: 20 U/L (ref 11–59)

## 2012-08-10 MED ORDER — GI COCKTAIL ~~LOC~~
30.0000 mL | Freq: Once | ORAL | Status: AC
  Administered 2012-08-10: 30 mL via ORAL
  Filled 2012-08-10: qty 30

## 2012-08-10 MED ORDER — FAMOTIDINE 20 MG PO TABS: 20.0000 mg | ORAL_TABLET | Freq: Two times a day (BID) | ORAL | Status: DC

## 2012-08-10 NOTE — ED Notes (Signed)
Pt c/o epigastric pain from hiatal hernia and SOB x 2 days

## 2012-08-10 NOTE — ED Provider Notes (Signed)
History     CSN: [id number]  Arrival date & time 08/10/12  1228   First MD Initiated Contact with Patient 08/10/12 1404      Chief Complaint  Patient presents with  . Abdominal Pain    (Consider location/radiation/quality/duration/timing/severity/associated sxs/prior treatment) HPI Comments: Pt is a 52 y/o female with hx of Htn and arthritis who presents with abd pain X 2 days - she had initially had n/v the night before and then gradually developed an epigastric pain that radiates to her mid back.  This is constant, worse with eating and associated with nausea.  She denies changes in her bowel habits. And has minimal SOB, no cough, no fever.  She does have an occasional glass of wine but denies alcoholism or heavy intake.  She has hx of hysterectomy/ appy but no other abd surgery.  Patient is a 52 y.o. female presenting with abdominal pain. The history is provided by the patient and a friend.  Abdominal Pain Associated symptoms include abdominal pain.    Past Medical History  Diagnosis Date  . Allergy   . Arthritis   . DJD (degenerative joint disease)   . Hypertension     Past Surgical History  Procedure Laterality Date  . Abdominal hysterectomy      1993    Family History  Problem Relation Age of Onset  . Arthritis Mother   . Cancer Mother     cancer  . Arthritis Father   . Arthritis Other   . Cancer Paternal Aunt     breast    History  Substance Use Topics  . Smoking status: Former Games developer  . Smokeless tobacco: Not on file  . Alcohol Use: No    OB History   Grav Para Term Preterm Abortions TAB SAB Ect Mult Living   1         1      Review of Systems  Gastrointestinal: Positive for abdominal pain.  All other systems reviewed and are negative.    Allergies  Aspirin; Lisinopril; Penicillins; and Sulfa antibiotics  Home Medications   Current Outpatient Rx  Name  Route  Sig  Dispense  Refill  . albuterol (PROVENTIL,VENTOLIN) 90 MCG/ACT inhaler    Inhalation   Inhale 2 puffs into the lungs every 6 (six) hours as needed.           . ALPRAZolam (XANAX) 0.5 MG tablet   Oral   Take 0.5 mg by mouth 2 (two) times daily as needed for sleep or anxiety.         Marland Kitchen amitriptyline (ELAVIL) 10 MG tablet   Oral   Take 10 mg by mouth at bedtime.         . cyclobenzaprine (FLEXERIL) 5 MG tablet   Oral   Take 1 tablet (5 mg total) by mouth at bedtime as needed.   30 tablet   0   . hydrochlorothiazide 25 MG tablet   Oral   Take 25 mg by mouth daily.           Marland Kitchen oxyCODONE (ROXICODONE) 15 MG immediate release tablet   Oral   Take 15 mg by mouth every 6 (six) hours as needed for pain.         . famotidine (PEPCID) 20 MG tablet   Oral   Take 1 tablet (20 mg total) by mouth 2 (two) times daily.   30 tablet   0     BP 129/86  Pulse 90  Temp(Src) 98.8 F (37.1 C) (Oral)  Resp 24  SpO2 96%  Physical Exam  Nursing note and vitals reviewed. Constitutional: She appears well-developed and well-nourished. No distress.  HENT:  Head: Normocephalic and atraumatic.  Mouth/Throat: Oropharynx is clear and moist. No oropharyngeal exudate.  Eyes: Conjunctivae and EOM are normal. Pupils are equal, round, and reactive to light. Right eye exhibits no discharge. Left eye exhibits no discharge. No scleral icterus.  Neck: Normal range of motion. Neck supple. No JVD present. No thyromegaly present.  Cardiovascular: Normal rate, regular rhythm, normal heart sounds and intact distal pulses.  Exam reveals no gallop and no friction rub.   No murmur heard. Pulmonary/Chest: Effort normal and breath sounds normal. No respiratory distress. She has no wheezes. She has no rales.  Abdominal: Soft. Bowel sounds are normal. She exhibits no distension and no mass. There is tenderness ( epigastric ttp with mild guarding, no other upper abd ttp.  no peritoneal sx.).  Musculoskeletal: Normal range of motion. She exhibits no edema and no tenderness.   Lymphadenopathy:    She has no cervical adenopathy.  Neurological: She is alert. Coordination normal.  Skin: Skin is warm and dry. No rash noted. No erythema.  Psychiatric: She has a normal mood and affect. Her behavior is normal.    ED Course  Procedures (including critical care time)  Labs Reviewed  COMPREHENSIVE METABOLIC PANEL - Abnormal; Notable for the following:    Glucose, Bld 102 (*)    GFR calc non Af Amer 60 (*)    GFR calc Af Amer 69 (*)    All other components within normal limits  CBC WITH DIFFERENTIAL - Abnormal; Notable for the following:    WBC 12.9 (*)    MCV 74.1 (*)    MCH 24.6 (*)    Neutro Abs 8.8 (*)    Monocytes Absolute 1.1 (*)    All other components within normal limits  LIPASE, BLOOD  TROPONIN I  URINALYSIS, ROUTINE W REFLEX MICROSCOPIC   No results found.   1. Abdominal pain       MDM  Pt has normla ECG, has epigastric pain that is worsened with eating - has leukocytosis, lipase added to labs, CMP without transaminitis or elevated bilirubin.  Gi cocktail.  Ddx includes pancreatitis / gastritis / PUD / chole though less likely.  ED ECG REPORT  I personally interpreted this EKG   Date: 08/10/2012   Rate: 97  Rhythm: normal sinus rhythm  QRS Axis: normal  Intervals: normal  ST/T Wave abnormalities: normal  Conduction Disutrbances:none  Narrative Interpretation:   Old EKG Reviewed: no old  Labs have shown leukocytosis, no elevation in lipase, CMP normal and troponin normal - needs f/u and will start on Pepcid as she had significant improvement in her pain with GI cocktail.  She has minimal ttp on repeat exam, and has been requesting food since arrival.  I have offered further testing with CT to r/o other source of pain and she declines.  Will d/c at pt's request.    Meds given in ED:  Medications  gi cocktail (Maalox,Lidocaine,Donnatal) (30 mLs Oral Given 08/10/12 1450)    New Prescriptions   FAMOTIDINE (PEPCID) 20 MG TABLET    Take  1 tablet (20 mg total) by mouth 2 (two) times daily.           Vida Roller, MD 08/10/12 [phone]

## 2012-08-11 LAB — URINE CULTURE

## 2012-08-14 ENCOUNTER — Telehealth (HOSPITAL_COMMUNITY): Payer: Self-pay | Admitting: *Deleted

## 2012-08-14 NOTE — Telephone Encounter (Signed)
Telephoned patient at home # and left message to return call to [facility] 

## 2012-08-14 NOTE — Telephone Encounter (Signed)
Patient returned call. Advised patient was time to follow up with [facility] for breast ultrasound. Patient will call and schedule.

## 2013-08-02 ENCOUNTER — Encounter: Payer: Self-pay | Admitting: Physical Medicine & Rehabilitation

## 2013-08-26 ENCOUNTER — Telehealth (HOSPITAL_COMMUNITY): Payer: Self-pay | Admitting: *Deleted

## 2013-08-26 NOTE — Telephone Encounter (Signed)
Patient called complaining of a breast lump. [facility] and [facility] has [facility]. [facility]. Explained to patient that since her [facility] card has expired that [facility] to cover the cost of the diagnostic mammogram. [facility] for next Tuesday, August 31, 2013 at 0745. Patient stated that will work and she is going to contact [facility] to reschedule diagnostic mammogram to next Tuesday after [facility] appointment.

## 2013-08-31 ENCOUNTER — Ambulatory Visit (HOSPITAL_COMMUNITY)
Admission: RE | Admit: 2013-08-31 | Discharge: 2013-08-31 | Disposition: A | Discharge status: Home / Self Care | Payer: Self-pay | Source: Ambulatory Visit | Attending: Obstetrics and Gynecology | Admitting: Obstetrics and Gynecology

## 2013-08-31 ENCOUNTER — Encounter (HOSPITAL_COMMUNITY): Payer: Self-pay

## 2013-08-31 VITALS — BP 120/72 | Temp 98.2°F | Ht 65.0 in | Wt 213.0 lb

## 2013-08-31 DIAGNOSIS — Z1239 Encounter for other screening for malignant neoplasm of breast: Secondary | ICD-10-CM

## 2013-08-31 DIAGNOSIS — N6312 Unspecified lump in the right breast, upper inner quadrant: Secondary | ICD-10-CM | POA: Insufficient documentation

## 2013-08-31 NOTE — Patient Instructions (Signed)
Taught Lindsey JulyRogena Mercer how to perform BSE and gave educational materials to take home. Patient did not need a Pap smear today due to a history of a hysterectomy for benign reasons 22 years ago. Let patient know that she will not need any further pap smears due to her history of a hysterectomy for benign reasons. [facility] for diagnostic mammogram. Appointment scheduled for Tuesday, Mercer 14, 2015 at 0845. Patient aware of appointment and will be there.  Lindsey Lindsey Mercer verbalized understanding.  Brannock, Lindsey Maserhristine Poll, RN 9:25 AM

## 2013-08-31 NOTE — Progress Notes (Signed)
Complaints of a right breast lump x 1 week that has moved. Patient stated it is very sore and skin is rough. Patient stated pain comes and goes rating pain at a 7-8 out of 10. Patient states it is a pulling pain that hurts more when lays down.  Pap Smear:  Pap smear not performed today. Patient has a history of a hysterectomy 22 years ago for fibroids. Per patient no history of abnormal Pap smears. No Pap smear results in EPIC.   Physical exam: Breasts Breasts symmetrical. No skin abnormalities bilateral breasts. No nipple retraction bilateral breasts. No nipple discharge bilateral breasts. No lymphadenopathy. No lumps palpated left breast. Palpated lump within the right breast at 1 o'clock under areola. No complaints of pain or tenderness on exam. Patient referred to [facility] for diagnostic mammogram. Appointment scheduled for Tuesday, August 31, 2013 at 0845.    Pelvic/Bimanual No Pap smear completed today since patient has a history of a hysterectomy for benign reasons. Pap smear not indicated per [facility] guidelines.

## 2013-09-03 ENCOUNTER — Other Ambulatory Visit: Payer: Self-pay

## 2013-09-03 ENCOUNTER — Ambulatory Visit: Payer: Self-pay

## 2013-09-15 ENCOUNTER — Encounter (HOSPITAL_COMMUNITY): Payer: Self-pay

## 2013-09-16 ENCOUNTER — Ambulatory Visit: Payer: Self-pay

## 2013-09-23 ENCOUNTER — Other Ambulatory Visit: Payer: Self-pay

## 2013-09-23 ENCOUNTER — Ambulatory Visit: Payer: Self-pay

## 2013-10-04 ENCOUNTER — Ambulatory Visit: Payer: Self-pay | Admitting: Physical Medicine & Rehabilitation

## 2013-11-01 ENCOUNTER — Ambulatory Visit: Payer: Self-pay | Admitting: Physical Medicine & Rehabilitation

## 2013-11-01 ENCOUNTER — Encounter: Payer: Self-pay | Attending: Physical Medicine & Rehabilitation

## 2013-12-20 ENCOUNTER — Encounter (HOSPITAL_COMMUNITY): Payer: Self-pay

## 2014-02-04 ENCOUNTER — Ambulatory Visit: Payer: Self-pay

## 2014-02-04 ENCOUNTER — Other Ambulatory Visit: Payer: Self-pay

## 2016-08-09 ENCOUNTER — Encounter (HOSPITAL_COMMUNITY): Payer: Self-pay | Admitting: Emergency Medicine

## 2016-08-09 ENCOUNTER — Emergency Department (HOSPITAL_COMMUNITY)
Admission: EM | Admit: 2016-08-09 | Discharge: 2016-08-09 | Disposition: A | Discharge status: Home / Self Care | Payer: Self-pay | Attending: Emergency Medicine | Admitting: Emergency Medicine

## 2016-08-09 DIAGNOSIS — Z87891 Personal history of nicotine dependence: Secondary | ICD-10-CM | POA: Insufficient documentation

## 2016-08-09 DIAGNOSIS — I10 Essential (primary) hypertension: Secondary | ICD-10-CM | POA: Insufficient documentation

## 2016-08-09 DIAGNOSIS — Z79899 Other long term (current) drug therapy: Secondary | ICD-10-CM | POA: Insufficient documentation

## 2016-08-09 DIAGNOSIS — G44209 Tension-type headache, unspecified, not intractable: Secondary | ICD-10-CM | POA: Insufficient documentation

## 2016-08-09 MED ORDER — DIPHENHYDRAMINE HCL 50 MG/ML IJ SOLN
25.0000 mg | Freq: Once | INTRAMUSCULAR | Status: AC
  Administered 2016-08-09: 25 mg via INTRAVENOUS
  Filled 2016-08-09: qty 1

## 2016-08-09 MED ORDER — KETOROLAC TROMETHAMINE 30 MG/ML IJ SOLN
15.0000 mg | Freq: Once | INTRAMUSCULAR | Status: AC
  Administered 2016-08-09: 15 mg via INTRAVENOUS
  Filled 2016-08-09: qty 1

## 2016-08-09 MED ORDER — PROCHLORPERAZINE EDISYLATE 5 MG/ML IJ SOLN
10.0000 mg | Freq: Once | INTRAMUSCULAR | Status: AC
  Administered 2016-08-09: 10 mg via INTRAVENOUS
  Filled 2016-08-09: qty 2

## 2016-08-09 MED ORDER — ACETAMINOPHEN 500 MG PO TABS
1000.0000 mg | ORAL_TABLET | Freq: Once | ORAL | Status: AC
  Administered 2016-08-09: 1000 mg via ORAL
  Filled 2016-08-09: qty 2

## 2016-08-09 MED ORDER — DEXAMETHASONE SODIUM PHOSPHATE 10 MG/ML IJ SOLN
10.0000 mg | Freq: Once | INTRAMUSCULAR | Status: AC
  Administered 2016-08-09: 10 mg via INTRAVENOUS
  Filled 2016-08-09: qty 1
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
## 2016-08-09 NOTE — Discharge Instructions (Signed)
Take 4 over the counter ibuprofen tablets 3 times a day or 2 over-the-counter naproxen tablets twice a day for pain. Also take tylenol 1000mg (2 extra strength) four times a day. Take this as needed for headache.  Do not take medicine to keep you from having a headache later in the day, that could cause a headache!

## 2016-08-09 NOTE — ED Triage Notes (Signed)
Pt to ER for 3 day hx of frontal headache radiating down to neck. Reports nausea, vomiting, and photosensitivity. A/o x4. Reports recent significant stress in life and believes this is the reason. No hx of migraines per patient. Hx of hypertension.

## 2016-08-12 ENCOUNTER — Telehealth: Payer: Self-pay | Admitting: General Practice

## 2016-12-12 NOTE — Telephone Encounter (Signed)
Error

## 2017-02-26 ENCOUNTER — Encounter (HOSPITAL_COMMUNITY): Payer: Self-pay

## 2017-03-13 ENCOUNTER — Ambulatory Visit (HOSPITAL_COMMUNITY)
Admission: EM | Admit: 2017-03-13 | Discharge: 2017-03-13 | Disposition: A | Discharge status: Home / Self Care | Payer: Self-pay | Attending: Family Medicine | Admitting: Family Medicine

## 2017-03-13 ENCOUNTER — Encounter (HOSPITAL_COMMUNITY): Payer: Self-pay | Admitting: Emergency Medicine

## 2017-03-13 DIAGNOSIS — R69 Illness, unspecified: Secondary | ICD-10-CM

## 2017-03-13 DIAGNOSIS — J111 Influenza due to unidentified influenza virus with other respiratory manifestations: Secondary | ICD-10-CM

## 2017-03-13 MED ORDER — HYDROCODONE-HOMATROPINE 5-1.5 MG/5ML PO SYRP: 5.0000 mL | ORAL_SOLUTION | Freq: Four times a day (QID) | ORAL | 0 refills | Status: DC | PRN

## 2017-03-13 NOTE — Discharge Instructions (Signed)

## 2017-03-13 NOTE — ED Triage Notes (Signed)
PT reports cough, congestion, chills, SOB, and body aches that started Tuesday.

## 2017-03-18 NOTE — ED Provider Notes (Signed)
[facility]   [id number] 03/13/17 Arrival Time: 1512  ASSESSMENT & PLAN:  1. Influenza-like illness     Meds ordered this encounter  Medications  . HYDROcodone-homatropine (HYCODAN) 5-1.5 MG/5ML syrup    Sig: Take 5 mLs by mouth every 6 (six) hours as needed for cough.    Dispense:  90 mL    Refill:  0   Cough medication sedation precautions. Discussed typical duration of symptoms. OTC symptom care as needed. Ensure adequate fluid intake and rest. May f/u with PCP or here as needed.  Reviewed expectations re: course of current medical issues. Questions answered. Outlined signs and symptoms indicating need for more acute intervention. Patient verbalized understanding. After Visit Summary given.   SUBJECTIVE: History from: patient.  Lindsey Mercer is a 57 y.o. female who presents with complaint of nasal congestion, post-nasal drainage, and a persistent dry cough. Onset abrupt, approximately a few days ago. Overall fatigued with body aches. SOB: none. Wheezing: none. Fever: questions subjective. Overall normal PO intake without n/v. Sick contacts: no. OTC treatment: None.   Social History   Tobacco Use  Smoking Status Former Smoker  Smokeless Tobacco Never Used    ROS: As per HPI.   OBJECTIVE:  Vitals:   03/13/17 1602 03/13/17 1604 03/13/17 1605  BP:   121/69  Pulse: 91    Resp: 18    Temp: 98.9 F (37.2 C)    TempSrc: Oral    SpO2: 99%    Weight:  200 lb (90.7 kg)      General appearance: alert; appears fatigued HEENT: nasal congestion; clear runny nose; throat irritation secondary to post-nasal drainage Neck: supple without LAD Lungs: unlabored respirations, symmetrical air entry; cough: moderate; no respiratory distress Skin: warm and dry Psychological: alert and cooperative; normal mood and affect   Allergies  Allergen Reactions  . Aspirin Hives and Nausea Only  . Lisinopril     REACTION: Dry cough  . Penicillins     REACTION:  hives  . Sulfa Antibiotics Hives    Past Medical History:  Diagnosis Date  . Allergy   . Arthritis   . DJD (degenerative joint disease)   . Hypertension    Family History  Problem Relation Age of Onset  . Arthritis Mother   . Breast cancer Mother   . Hypertension Mother   . Cancer Paternal Aunt        breast  . Arthritis Father   . Hypertension Father   . Arthritis Other   . Breast cancer Sister   . Hypertension Sister    Social History   Socioeconomic History  . Marital status: Widowed    Spouse name: Not on file  . Number of children: Not on file  . Years of education: Not on file  . Highest education level: Not on file  Social Needs  . Financial resource strain: Not on file  . Food insecurity - worry: Not on file  . Food insecurity - inability: Not on file  . Transportation needs - medical: Not on file  . Transportation needs - non-medical: Not on file  Occupational History  . Not on file  Tobacco Use  . Smoking status: Former Games developer  . Smokeless tobacco: Never Used  Substance and Sexual Activity  . Alcohol use: Yes    Comment: rarely  . Drug use: No  . Sexual activity: Not Currently    Birth control/protection: Surgical  Other Topics Concern  . Not on file  Social History Narrative  .  Not on file           Mardella LaymanHagler, Jeremiah Tarpley, MD 03/18/17 [phone]

## 2019-11-18 ENCOUNTER — Ambulatory Visit: Payer: Self-pay | Attending: Family

## 2019-11-18 DIAGNOSIS — Z23 Encounter for immunization: Secondary | ICD-10-CM

## 2019-12-20 ENCOUNTER — Other Ambulatory Visit: Payer: Self-pay

## 2019-12-20 ENCOUNTER — Ambulatory Visit (HOSPITAL_COMMUNITY): Admission: EM | Admit: 2019-12-20 | Discharge: 2019-12-20 | Discharge status: Against Medical Advice | Payer: Self-pay

## 2019-12-21 ENCOUNTER — Ambulatory Visit (HOSPITAL_COMMUNITY)
Admission: EM | Admit: 2019-12-21 | Discharge: 2019-12-21 | Disposition: A | Discharge status: Home / Self Care | Payer: Self-pay | Attending: Family Medicine | Admitting: Family Medicine

## 2019-12-21 ENCOUNTER — Other Ambulatory Visit: Payer: Self-pay

## 2019-12-21 ENCOUNTER — Encounter (HOSPITAL_COMMUNITY): Payer: Self-pay

## 2019-12-21 VITALS — BP 162/90 | HR 88 | Temp 98.4°F | Resp 16 | Ht 65.0 in | Wt 215.0 lb

## 2019-12-21 DIAGNOSIS — K0889 Other specified disorders of teeth and supporting structures: Secondary | ICD-10-CM

## 2019-12-21 MED ORDER — CLINDAMYCIN HCL 300 MG PO CAPS: 300.0000 mg | ORAL_CAPSULE | Freq: Three times a day (TID) | ORAL | 0 refills | Status: DC

## 2019-12-21 MED ORDER — HYDROCODONE-ACETAMINOPHEN 5-325 MG PO TABS: 1.0000 | ORAL_TABLET | Freq: Four times a day (QID) | ORAL | 0 refills | Status: DC | PRN

## 2019-12-21 NOTE — ED Provider Notes (Signed)
[facility]   [id number] 12/21/19 Arrival Time: 1046  ASSESSMENT & PLAN:  1. Pain, dental    No sign of abscess requiring I&D at this time. Discussed.  Meds ordered this encounter  Medications  . HYDROcodone-acetaminophen (NORCO/VICODIN) 5-325 MG tablet    Sig: Take 1 tablet by mouth every 6 (six) hours as needed for moderate pain or severe pain.    Dispense:  8 tablet    Refill:  0  . clindamycin (CLEOCIN) 300 MG capsule    Sig: Take 1 capsule (300 mg total) by mouth 3 (three) times daily.    Dispense:  30 capsule    Refill:  0    [place] Controlled Substances Registry consulted for this patient. I feel the risk/benefit ratio today is favorable for proceeding with this prescription for a controlled substance. Medication sedation precautions given.  Dental resource written instructions given. She will schedule dental evaluation as soon as possible if not improving over the next 24-48 hours.  Reviewed expectations re: course of current medical issues. Questions answered. Outlined signs and symptoms indicating need for more acute intervention. Patient verbalized understanding. After Visit Summary given.   SUBJECTIVE:  Lindsey Mercer is a 59 y.o. female who reports abrupt onset of right lower dental pain described as aching/throbbing. Present for 1 day. Fever: absent. Tolerating PO intake but reports pain with chewing. Normal swallowing. She does not see a dentist regularly. No neck swelling or pain. OTC analgesics without relief.  ROS: As per HPI.  OBJECTIVE: Vitals:   12/21/19 1119 12/21/19 1137  BP: (!) 162/90   Pulse: 88   Resp: 16   Temp: 98.4 F (36.9 C)   TempSrc: Oral   SpO2: 97%   Weight:  97.5 kg  Height:  5\' 5"  (1.651 m)    General appearance: alert; no distress HENT: normocephalic; atraumatic; dentition: poor; right lower gums without areas of fluctuance, drainage, or bleeding and with tenderness to palpation; normal jaw movement without  difficulty Neck: supple without LAD; FROM; trachea midline Lungs: normal respirations; unlabored; speaks full sentences without difficulty Skin: warm and dry Psychological: alert and cooperative; normal mood and affect  Allergies  Allergen Reactions  . Aspirin Hives and Nausea Only  . Lisinopril     REACTION: Dry cough  . Penicillins     REACTION: hives  . Sulfa Antibiotics Hives    Past Medical History:  Diagnosis Date  . Allergy   . Arthritis   . DJD (degenerative joint disease)   . Hypertension    Social History   Socioeconomic History  . Marital status: Widowed    Spouse name: Not on file  . Number of children: Not on file  . Years of education: Not on file  . Highest education level: Not on file  Occupational History  . Not on file  Tobacco Use  . Smoking status: Former  . Smokeless tobacco: Never Used  Substance and Sexual Activity  . Alcohol use: Yes    Comment: rarely  . Drug use: No  . Sexual activity: Not Currently    Birth control/protection: Surgical  Other Topics Concern  . Not on file  Social History Narrative  . Not on file   Social Determinants of Health   Financial Resource Strain:   . Difficulty of Paying Living Expenses: Not on file  Food Insecurity:   . Worried About Games developer in the Last Year: Not on file  . Ran Out of Food in the  Last Year: Not on file  Transportation Needs:   . Lack of Transportation (Medical): Not on file  . Lack of Transportation (Non-Medical): Not on file  Physical Activity:   . Days of Exercise per Week: Not on file  . Minutes of Exercise per Session: Not on file  Stress:   . Feeling of Stress : Not on file  Social Connections:   . Frequency of Communication with Friends and Family: Not on file  . Frequency of Social Gatherings with Friends and Family: Not on file  . Attends Religious Services: Not on file  . Active Member of Clubs or Organizations: Not on file  . Attends Tax inspector Meetings: Not on file  . Marital Status: Not on file  Intimate Partner Violence:   . Fear of Current or Ex-Partner: Not on file  . Emotionally Abused: Not on file  . Physically Abused: Not on file  . Sexually Abused: Not on file   Family History  Problem Relation Age of Onset  . Arthritis Mother   . Breast cancer Mother   . Hypertension Mother   . Cancer Paternal Aunt        breast  . Arthritis Father   . Hypertension Father   . Arthritis Other   . Breast cancer Sister   . Hypertension Sister    Past Surgical History:  Procedure Laterality Date  . ABDOMINAL HYSTERECTOMY     1993     Mardella Layman, MD 12/21/19 1319

## 2019-12-21 NOTE — ED Triage Notes (Signed)
Pt has lower rt dental pain . Pt reports filling fell out one month ago. Sever pain started yesterday.

## 2019-12-21 NOTE — Discharge Instructions (Addendum)

## 2020-03-24 ENCOUNTER — Ambulatory Visit (HOSPITAL_COMMUNITY): Payer: Self-pay

## 2020-03-25 ENCOUNTER — Other Ambulatory Visit: Payer: Self-pay

## 2020-03-25 ENCOUNTER — Ambulatory Visit (HOSPITAL_COMMUNITY)
Admission: EM | Admit: 2020-03-25 | Discharge: 2020-03-25 | Disposition: A | Discharge status: Home / Self Care | Payer: Self-pay

## 2020-03-25 ENCOUNTER — Encounter (HOSPITAL_COMMUNITY): Payer: Self-pay

## 2020-03-25 DIAGNOSIS — R112 Nausea with vomiting, unspecified: Secondary | ICD-10-CM

## 2020-03-25 NOTE — ED Triage Notes (Signed)
Pt states that she has a virus that has been going on since Tuesday.  Pt has chewing ginger to help with sxs
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
## 2021-12-04 ENCOUNTER — Ambulatory Visit (HOSPITAL_COMMUNITY)
Admission: EM | Admit: 2021-12-04 | Discharge: 2021-12-04 | Disposition: A | Discharge status: Home / Self Care | Payer: Commercial Managed Care - HMO | Attending: Urgent Care | Admitting: Urgent Care

## 2021-12-04 ENCOUNTER — Encounter (HOSPITAL_COMMUNITY): Payer: Self-pay | Admitting: *Deleted

## 2021-12-04 DIAGNOSIS — R519 Headache, unspecified: Secondary | ICD-10-CM

## 2021-12-04 DIAGNOSIS — I10 Essential (primary) hypertension: Secondary | ICD-10-CM

## 2021-12-04 MED ORDER — CLONIDINE HCL 0.1 MG PO TABS: 0.1000 mg | ORAL_TABLET | Freq: Two times a day (BID) | ORAL | 0 refills | Status: DC

## 2021-12-04 MED ORDER — RIZATRIPTAN BENZOATE 5 MG PO TBDP: 5.0000 mg | ORAL_TABLET | ORAL | 0 refills | Status: DC | PRN

## 2021-12-04 MED ORDER — CLONIDINE HCL 0.1 MG PO TABS
ORAL_TABLET | ORAL | Status: AC
  Filled 2021-12-04: qty 1

## 2021-12-04 MED ORDER — CLONIDINE HCL 0.1 MG PO TABS
0.1000 mg | ORAL_TABLET | Freq: Once | ORAL | Status: AC
  Administered 2021-12-04: 0.1 mg via ORAL

## 2021-12-04 NOTE — Discharge Instructions (Addendum)
I suspect your headache to be secondary to your current blood pressure. You were given clonidine in our office, which you had been on in 2020 when your blood pressure was normal.  Please monitor your BP at home, goal readings are 120/80. I have refilled your clonidine. This should be taken twice daily. It may make you drowsy. Do not drive until you know how it will affect you. You must find a PCP and schedule a follow up in the next two weeks. If you still have a headache despite BP normalizing, you may take one tab of rizatriptan. Place this under your tongue. If your headaches continue or worsen, or if you develop blurred vision, neck stiffness, weakness, or dizziness head to the ER.

## 2021-12-04 NOTE — ED Triage Notes (Signed)
Pt states she woke up with a headache on the left side she does have a hx of migraines but hasnt had one in a long time. She is also very nauseous. She did take a med (but she cant think of the name)  but they made her go to sleep.
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
## 2022-01-15 ENCOUNTER — Ambulatory Visit (HOSPITAL_COMMUNITY): Payer: Commercial Managed Care - HMO

## 2022-05-14 ENCOUNTER — Other Ambulatory Visit: Payer: Self-pay

## 2022-05-16 ENCOUNTER — Ambulatory Visit (INDEPENDENT_AMBULATORY_CARE_PROVIDER_SITE_OTHER): Payer: Self-pay | Admitting: Primary Care

## 2022-07-25 ENCOUNTER — Encounter (INDEPENDENT_AMBULATORY_CARE_PROVIDER_SITE_OTHER): Payer: Self-pay

## 2022-07-25 ENCOUNTER — Ambulatory Visit (INDEPENDENT_AMBULATORY_CARE_PROVIDER_SITE_OTHER): Payer: 59 | Admitting: Primary Care

## 2022-09-30 ENCOUNTER — Telehealth (INDEPENDENT_AMBULATORY_CARE_PROVIDER_SITE_OTHER): Payer: Self-pay | Admitting: Primary Care

## 2022-09-30 NOTE — Telephone Encounter (Signed)
Spoke to pt. Will be at apt.  

## 2022-10-01 ENCOUNTER — Ambulatory Visit (INDEPENDENT_AMBULATORY_CARE_PROVIDER_SITE_OTHER): Payer: 59 | Admitting: Primary Care

## 2023-02-16 ENCOUNTER — Encounter (HOSPITAL_COMMUNITY): Payer: Self-pay

## 2023-02-16 ENCOUNTER — Emergency Department (HOSPITAL_COMMUNITY): Payer: 59

## 2023-02-16 ENCOUNTER — Inpatient Hospital Stay (HOSPITAL_COMMUNITY)
Admission: EM | Admit: 2023-02-16 | Discharge: 2023-02-20 | DRG: 065 | Disposition: A | Discharge status: Home Health | Payer: 59 | Attending: Internal Medicine | Admitting: Internal Medicine

## 2023-02-16 ENCOUNTER — Other Ambulatory Visit: Payer: Self-pay

## 2023-02-16 DIAGNOSIS — Z8249 Family history of ischemic heart disease and other diseases of the circulatory system: Secondary | ICD-10-CM

## 2023-02-16 DIAGNOSIS — R1111 Vomiting without nausea: Secondary | ICD-10-CM | POA: Diagnosis not present

## 2023-02-16 DIAGNOSIS — F17210 Nicotine dependence, cigarettes, uncomplicated: Secondary | ICD-10-CM | POA: Diagnosis present

## 2023-02-16 DIAGNOSIS — R29810 Facial weakness: Secondary | ICD-10-CM | POA: Diagnosis present

## 2023-02-16 DIAGNOSIS — N179 Acute kidney failure, unspecified: Secondary | ICD-10-CM | POA: Diagnosis present

## 2023-02-16 DIAGNOSIS — Z7902 Long term (current) use of antithrombotics/antiplatelets: Secondary | ICD-10-CM

## 2023-02-16 DIAGNOSIS — Z716 Tobacco abuse counseling: Secondary | ICD-10-CM

## 2023-02-16 DIAGNOSIS — Z803 Family history of malignant neoplasm of breast: Secondary | ICD-10-CM

## 2023-02-16 DIAGNOSIS — Z91128 Patient's intentional underdosing of medication regimen for other reason: Secondary | ICD-10-CM

## 2023-02-16 DIAGNOSIS — I63441 Cerebral infarction due to embolism of right cerebellar artery: Principal | ICD-10-CM | POA: Diagnosis present

## 2023-02-16 DIAGNOSIS — F1410 Cocaine abuse, uncomplicated: Secondary | ICD-10-CM | POA: Diagnosis present

## 2023-02-16 DIAGNOSIS — R112 Nausea with vomiting, unspecified: Secondary | ICD-10-CM | POA: Diagnosis not present

## 2023-02-16 DIAGNOSIS — Z886 Allergy status to analgesic agent status: Secondary | ICD-10-CM

## 2023-02-16 DIAGNOSIS — I10 Essential (primary) hypertension: Secondary | ICD-10-CM | POA: Diagnosis not present

## 2023-02-16 DIAGNOSIS — Z6835 Body mass index (BMI) 35.0-35.9, adult: Secondary | ICD-10-CM

## 2023-02-16 DIAGNOSIS — G43909 Migraine, unspecified, not intractable, without status migrainosus: Secondary | ICD-10-CM | POA: Diagnosis present

## 2023-02-16 DIAGNOSIS — G4489 Other headache syndrome: Secondary | ICD-10-CM | POA: Diagnosis not present

## 2023-02-16 DIAGNOSIS — I6521 Occlusion and stenosis of right carotid artery: Secondary | ICD-10-CM | POA: Diagnosis present

## 2023-02-16 DIAGNOSIS — G8929 Other chronic pain: Secondary | ICD-10-CM | POA: Diagnosis present

## 2023-02-16 DIAGNOSIS — Z888 Allergy status to other drugs, medicaments and biological substances status: Secondary | ICD-10-CM

## 2023-02-16 DIAGNOSIS — I63233 Cerebral infarction due to unspecified occlusion or stenosis of bilateral carotid arteries: Secondary | ICD-10-CM | POA: Diagnosis not present

## 2023-02-16 DIAGNOSIS — Z91148 Patient's other noncompliance with medication regimen for other reason: Secondary | ICD-10-CM

## 2023-02-16 DIAGNOSIS — R7303 Prediabetes: Secondary | ICD-10-CM | POA: Diagnosis present

## 2023-02-16 DIAGNOSIS — R260 Ataxic gait: Secondary | ICD-10-CM | POA: Diagnosis present

## 2023-02-16 DIAGNOSIS — R9089 Other abnormal findings on diagnostic imaging of central nervous system: Secondary | ICD-10-CM | POA: Diagnosis not present

## 2023-02-16 DIAGNOSIS — E669 Obesity, unspecified: Secondary | ICD-10-CM | POA: Diagnosis present

## 2023-02-16 DIAGNOSIS — M1990 Unspecified osteoarthritis, unspecified site: Secondary | ICD-10-CM | POA: Diagnosis present

## 2023-02-16 DIAGNOSIS — I639 Cerebral infarction, unspecified: Secondary | ICD-10-CM | POA: Diagnosis not present

## 2023-02-16 DIAGNOSIS — H5500 Unspecified nystagmus: Secondary | ICD-10-CM | POA: Diagnosis present

## 2023-02-16 DIAGNOSIS — Z882 Allergy status to sulfonamides status: Secondary | ICD-10-CM

## 2023-02-16 DIAGNOSIS — R519 Headache, unspecified: Secondary | ICD-10-CM | POA: Diagnosis not present

## 2023-02-16 DIAGNOSIS — Z90710 Acquired absence of both cervix and uterus: Secondary | ICD-10-CM

## 2023-02-16 DIAGNOSIS — R29701 NIHSS score 1: Secondary | ICD-10-CM | POA: Diagnosis present

## 2023-02-16 DIAGNOSIS — Z8261 Family history of arthritis: Secondary | ICD-10-CM

## 2023-02-16 DIAGNOSIS — D72829 Elevated white blood cell count, unspecified: Secondary | ICD-10-CM | POA: Diagnosis present

## 2023-02-16 DIAGNOSIS — E7800 Pure hypercholesterolemia, unspecified: Secondary | ICD-10-CM | POA: Diagnosis present

## 2023-02-16 DIAGNOSIS — Z880 Allergy status to penicillin: Secondary | ICD-10-CM

## 2023-02-16 DIAGNOSIS — R079 Chest pain, unspecified: Secondary | ICD-10-CM | POA: Diagnosis not present

## 2023-02-16 LAB — BASIC METABOLIC PANEL
Anion gap: 14 (ref 5–15)
BUN: 21 mg/dL (ref 8–23)
CO2: 19 mmol/L — ABNORMAL LOW (ref 22–32)
Calcium: 9.2 mg/dL (ref 8.9–10.3)
Chloride: 106 mmol/L (ref 98–111)
Creatinine, Ser: 0.89 mg/dL (ref 0.44–1.00)
GFR, Estimated: >60 mL/min (ref >60)
Glucose, Bld: 115 mg/dL — ABNORMAL HIGH (ref 70–99)
Potassium: 3.9 mmol/L (ref 3.5–5.1)
Sodium: 139 mmol/L (ref 135–145)

## 2023-02-16 LAB — DIFFERENTIAL
Abs Immature Granulocytes: 0.03 10*3/uL (ref 0.00–0.07)
Basophils Absolute: 0 10*3/uL (ref 0.0–0.1)
Basophils Relative: 0 %
Eosinophils Absolute: 0 10*3/uL (ref 0.0–0.5)
Eosinophils Relative: 0 %
Immature Granulocytes: 0 %
Lymphocytes Relative: 15 %
Lymphs Abs: 2 10*3/uL (ref 0.7–4.0)
Monocytes Absolute: 1 10*3/uL (ref 0.1–1.0)
Monocytes Relative: 8 %
Neutro Abs: 9.8 10*3/uL — ABNORMAL HIGH (ref 1.7–7.7)
Neutrophils Relative %: 77 %

## 2023-02-16 LAB — CBC
HCT: 48.5 % — ABNORMAL HIGH (ref 36.0–46.0)
Hemoglobin: 15.6 g/dL — ABNORMAL HIGH (ref 12.0–15.0)
MCH: 25.6 pg — ABNORMAL LOW (ref 26.0–34.0)
MCHC: 32.2 g/dL (ref 30.0–36.0)
MCV: 79.5 fL — ABNORMAL LOW (ref 80.0–100.0)
Platelets: 372 10*3/uL (ref 150–400)
RBC: 6.1 MIL/uL — ABNORMAL HIGH (ref 3.87–5.11)
RDW: 16.1 % — ABNORMAL HIGH (ref 11.5–15.5)
WBC: 11.1 10*3/uL — ABNORMAL HIGH (ref 4.0–10.5)
nRBC: 0 % (ref 0.0–0.2)

## 2023-02-16 LAB — PROTIME-INR
INR: 1 (ref 0.8–1.2)
Prothrombin Time: 13.8 s (ref 11.4–15.2)

## 2023-02-16 LAB — APTT: aPTT: 31 s (ref 24–36)

## 2023-02-16 LAB — TROPONIN I (HIGH SENSITIVITY): Troponin I (High Sensitivity): 9 ng/L (ref <18)

## 2023-02-16 MED ORDER — HYDRALAZINE HCL 20 MG/ML IJ SOLN
10.0000 mg | Freq: Once | INTRAMUSCULAR | Status: AC
  Administered 2023-02-16: 10 mg via INTRAVENOUS
  Filled 2023-02-16: qty 1

## 2023-02-16 MED ORDER — LABETALOL HCL 5 MG/ML IV SOLN
10.0000 mg | INTRAVENOUS | Status: DC | PRN
  Administered 2023-02-20: 10 mg via INTRAVENOUS
  Filled 2023-02-16: qty 4

## 2023-02-16 MED ORDER — OXYCODONE-ACETAMINOPHEN 5-325 MG PO TABS
1.0000 | ORAL_TABLET | ORAL | Status: AC | PRN
  Administered 2023-02-16 – 2023-02-17 (×2): 1 via ORAL
  Filled 2023-02-16 (×2): qty 1

## 2023-02-16 MED ORDER — LORAZEPAM 2 MG/ML IJ SOLN
1.0000 mg | INTRAMUSCULAR | Status: DC | PRN
  Administered 2023-02-16 – 2023-02-20 (×2): 1 mg via INTRAVENOUS
  Filled 2023-02-16 (×2): qty 1

## 2023-02-16 NOTE — ED Triage Notes (Signed)
Pt advised staff she is now having chest pain. Pt moved back to triage area and new orders placed.

## 2023-02-16 NOTE — ED Triage Notes (Signed)
Patient reports headache first then nausea and vomiting x 4 days. Has HTN and is not taking her medications. BP 163/95 at time of triage.

## 2023-02-16 NOTE — ED Provider Notes (Cosign Needed)
[facility] Provider Note   CSN: [id number] Arrival date & time: 02/16/23  1648     History  Chief Complaint  Patient presents with   Headache    Mistydawn Towsley is a 62 y.o. female.  The history is provided by the patient and medical records. No language interpreter was used.  Headache    62 year old female significant history of hypertension, allergic rhinitis, chronic pain, hypercholesterolemia, presenting with complaint of headache.  Patient was brought here via EMS from home.  Patient states she had a history of high blood pressure but she felt that it was much better controlled therefore she stopped taking her blood pressure about 4 months ago.  The past week she endorsed having bouts of nausea and overall not feeling well.  She also complaining of right-sided headache for the past 3 to 4 days and now having some chest pressure sensation.  She checked her blood pressure and noted that it was elevated thus prompting this ER visit.  She also complains tingling sensation to the right side of her face.  She endorse dizziness, with room spinning sensation and also having difficulty with ambulation, requiring assistance from her friend. She denies any vision changes denies any shortness of breath productive cough, or having focal weakness.  Home Medications Prior to Admission medications   Medication Sig Start Date End Date Taking? Authorizing Provider  cloNIDine (CATAPRES) 0.1 MG tablet Take 1 tablet (0.1 mg total) by mouth 2 (two) times daily. 12/04/21   Crain, Whitney L, PA  rizatriptan (MAXALT-MLT) 5 MG disintegrating tablet Take 1 tablet (5 mg total) by mouth as needed for migraine. May repeat in 2 hours if needed 12/04/21   Guy Sandifer L, PA      Allergies    Aspirin, Lisinopril, Penicillins, and Sulfa antibiotics    Review of Systems   Review of Systems  Neurological:  Positive for headaches.  All other systems reviewed and  are negative.   Physical Exam Updated Vital Signs BP (!) 176/102 (BP Location: Left Arm)   Pulse 74   Temp 97.9 F (36.6 C) (Oral)   Resp 16   Ht 5\' 5"  (1.651 m)   Wt 97.5 kg   SpO2 100%   BMI 35.77 kg/m  Physical Exam Vitals and nursing note reviewed.  Constitutional:      General: She is not in acute distress.    Appearance: She is well-developed.  HENT:     Head: Atraumatic.  Eyes:     Conjunctiva/sclera: Conjunctivae normal.  Cardiovascular:     Rate and Rhythm: Normal rate and regular rhythm.  Pulmonary:     Effort: Pulmonary effort is normal.  Abdominal:     Palpations: Abdomen is soft.  Musculoskeletal:        General: Normal range of motion.     Cervical back: Neck supple.  Skin:    Findings: No rash.  Neurological:     Mental Status: She is alert and oriented to person, place, and time.     Comments: Neurologic exam:  Speech clear, pupils equal round reactive to light, extraocular movements intact  Normal peripheral visual fields Cranial nerves III through XII normal.  Mild right facial droop compared to left Follows commands, moves all extremities x4, normal strength to bilateral upper and lower extremities at all major muscle groups including grip Sensation normal with exception of slight decrease sensation to the right side of face compared to left. Coordination intact, no  limb ataxia, finger-nose-finger normal Rapid alternating movements normal No pronator drift Gait not tested as pt is unsteady on feet and can't ambulate   Psychiatric:        Mood and Affect: Mood normal.     ED Results / Procedures / Treatments   Labs (all labs ordered are listed, but only abnormal results are displayed) Labs Reviewed  CBC - Abnormal; Notable for the following components:      Result Value   WBC 11.1 (*)    RBC 6.10 (*)    Hemoglobin 15.6 (*)    HCT 48.5 (*)    MCV 79.5 (*)    MCH 25.6 (*)    RDW 16.1 (*)    All other components within normal limits   BASIC METABOLIC PANEL - Abnormal; Notable for the following components:   CO2 19 (*)    Glucose, Bld 115 (*)    All other components within normal limits  ETHANOL  PROTIME-INR  APTT  DIFFERENTIAL  COMPREHENSIVE METABOLIC PANEL  RAPID URINE DRUG SCREEN, HOSP PERFORMED  URINALYSIS, ROUTINE W REFLEX MICROSCOPIC  I-STAT CHEM 8, ED  TROPONIN I (HIGH SENSITIVITY)  TROPONIN I (HIGH SENSITIVITY)    EKG EKG Interpretation Date/Time:  Sunday February 16 2023 17:36:19 EST Ventricular Rate:  74 PR Interval:  128 QRS Duration:  72 QT Interval:  414 QTC Calculation: 459 R Axis:   68  Text Interpretation: Sinus rhythm with Premature supraventricular complexes Otherwise normal ECG  previous t-wave flattening now upright compared to prior EKG Confirmed by Elayne Snare ([id number]) on 02/16/2023 11:09:47 PM  Radiology CT Head Wo Contrast Result Date: 02/16/2023 CLINICAL DATA:  Headache EXAM: CT HEAD WITHOUT CONTRAST TECHNIQUE: Contiguous axial images were obtained from the base of the skull through the vertex without intravenous contrast. RADIATION DOSE REDUCTION: This exam was performed according to the departmental dose-optimization program which includes automated exposure control, adjustment of the mA and/or kV according to patient size and/or use of iterative reconstruction technique. COMPARISON:  None Available. FINDINGS: Brain: There is an acute/early subacute infarct of the inferior medial right cerebellum. Chronic ischemic white matter changes. Old right caudate head small vessel infarct. Vascular: Atherosclerotic calcification of the internal carotid arteries at the skull base. No abnormal hyperdensity of the major intracranial arteries or dural venous sinuses. Skull: The visualized skull base, calvarium and extracranial soft tissues are normal. Sinuses/Orbits: No fluid levels or advanced mucosal thickening of the visualized paranasal sinuses. No mastoid or middle ear effusion. Normal orbits.  Other: None. 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 No hemorrhage or mass effect. Electronically Signed   By: Deatra Robinson M.D.   On: 02/16/2023 23:07   DG Chest 2 View Result Date: 02/16/2023 CLINICAL DATA:  chest pain EXAM: CHEST - 2 VIEW COMPARISON:  02/26/2008. FINDINGS: The heart size and mediastinal contours are within normal limits. Both lungs are clear. No pneumothorax or pleural effusion. There are thoracic degenerative changes. IMPRESSION: No acute cardiopulmonary disease. Electronically Signed   By: Layla Maw M.D.   On: 02/16/2023 18:25    Procedures .Critical Care  Performed by: Fayrene Helper, PA-C Authorized by: Fayrene Helper, PA-C   Critical care provider statement:    Critical care time (minutes):  35   Critical care was time spent personally by me on the following activities:  Development of treatment plan with patient or surrogate, discussions with consultants, evaluation of patient's response to treatment, examination of patient, ordering and review of laboratory studies,  ordering and review of radiographic studies, ordering and performing treatments and interventions, pulse oximetry, re-evaluation of patient's condition and review of old charts     Medications Ordered in ED Medications  oxyCODONE-acetaminophen (PERCOCET/ROXICET) 5-325 MG per tablet 1 tablet (1 tablet Oral Given 02/16/23 1719)  labetalol (NORMODYNE) injection 10 mg (has no administration in time range)  hydrALAZINE (APRESOLINE) injection 10 mg (10 mg Intravenous Given 02/16/23 2310)    ED Course/ Medical Decision Making/ A&P                                 Medical Decision Making Amount and/or Complexity of Data Reviewed Labs: ordered. Radiology: ordered.  Risk Prescription drug management.   BP (!) 176/102 (BP Location: Left Arm)   Pulse 74   Temp 97.9 F (36.6 C) (Oral)   Resp 16   Ht 5\' 5"  (1.651 m)   Wt 97.5 kg   SpO2 100%   BMI 35.77  kg/m   108:76 PM  62 year old female significant history of hypertension, allergic rhinitis, chronic pain, hypercholesterolemia, presenting with complaint of headache.  Patient was brought here via EMS from home.  Patient states she had a history of high blood pressure but she felt that it was much better controlled therefore she stopped taking her blood pressure about 4 months ago.  The past week she endorsed having bouts of nausea and overall not feeling well.  She also complaining of right-sided headache for the past 3 to 4 days and now having some chest pressure sensation.  She checked her blood pressure and noted that it was elevated thus prompting this ER visit.  She also complains something sensation to the right side of her face.  She denies any vision changes denies any shortness of breath productive cough, focal weakness.  Exam notable for mild right-sided facial droop compared to the left with decrease sensation.  No other focal neurodeficit noted.  -Labs ordered, independently viewed and interpreted by me.  Labs remarkable for normal troponin -The patient was maintained on a cardiac monitor.  I personally viewed and interpreted the cardiac monitored which showed an underlying rhythm of: NSR -Imaging independently viewed and interpreted by me and I agree with radiologist's interpretation.  Result remarkable for head CT showing early subacute infarct of the inferior medial right cerebellum -This patient presents to the ED for concern of headache, this involves an extensive number of treatment options, and is a complaint that carries with it a high risk of complications and morbidity.  The differential diagnosis includes hemorrhagic stroke, ischemic stroke, tension headache, migraine, temporal arteritis, clustered headache -Co morbidities that complicate the patient evaluation includes HTN -Treatment includes hydralazine, percocet -Reevaluation of the patient after these medicines showed that  the patient stayed the same -PCP office notes or outside notes reviewed -Discussion with specialist neurologist Dr. Wilford Corner who request pt to be admitted by medicine, transfer to [facility], -Escalation to admission/observation considered: patient is agreeable with admission.  11:45 PM I have consulted with hospitalist Dr. Cyndia Bent who has reached out to care placement but unfortunately patient would not have a bed available in the next 4 hours.  Therefore, I have reached out to ER attending at [facility] and spoke with Dr. 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

## 2023-02-16 NOTE — ED Notes (Signed)
Pt started complaining  of chest pain while sitting in lobby.Tech informed Consulting civil engineer. Per charged RN an EKG was performed and given to Dr

## 2023-02-17 ENCOUNTER — Inpatient Hospital Stay (HOSPITAL_COMMUNITY): Payer: 59

## 2023-02-17 DIAGNOSIS — R519 Headache, unspecified: Secondary | ICD-10-CM | POA: Diagnosis not present

## 2023-02-17 DIAGNOSIS — I6523 Occlusion and stenosis of bilateral carotid arteries: Secondary | ICD-10-CM | POA: Diagnosis not present

## 2023-02-17 DIAGNOSIS — I639 Cerebral infarction, unspecified: Secondary | ICD-10-CM | POA: Diagnosis not present

## 2023-02-17 DIAGNOSIS — I6389 Other cerebral infarction: Secondary | ICD-10-CM | POA: Diagnosis not present

## 2023-02-17 DIAGNOSIS — R7303 Prediabetes: Secondary | ICD-10-CM | POA: Diagnosis not present

## 2023-02-17 DIAGNOSIS — Z888 Allergy status to other drugs, medicaments and biological substances status: Secondary | ICD-10-CM | POA: Diagnosis not present

## 2023-02-17 DIAGNOSIS — N179 Acute kidney failure, unspecified: Secondary | ICD-10-CM | POA: Diagnosis not present

## 2023-02-17 DIAGNOSIS — I6521 Occlusion and stenosis of right carotid artery: Secondary | ICD-10-CM | POA: Diagnosis not present

## 2023-02-17 DIAGNOSIS — Z882 Allergy status to sulfonamides status: Secondary | ICD-10-CM | POA: Diagnosis not present

## 2023-02-17 DIAGNOSIS — E669 Obesity, unspecified: Secondary | ICD-10-CM | POA: Diagnosis not present

## 2023-02-17 DIAGNOSIS — Z91148 Patient's other noncompliance with medication regimen for other reason: Secondary | ICD-10-CM | POA: Diagnosis not present

## 2023-02-17 DIAGNOSIS — I663 Occlusion and stenosis of cerebellar arteries: Secondary | ICD-10-CM | POA: Diagnosis not present

## 2023-02-17 DIAGNOSIS — R9089 Other abnormal findings on diagnostic imaging of central nervous system: Secondary | ICD-10-CM | POA: Diagnosis not present

## 2023-02-17 DIAGNOSIS — D72829 Elevated white blood cell count, unspecified: Secondary | ICD-10-CM | POA: Diagnosis not present

## 2023-02-17 DIAGNOSIS — Z6835 Body mass index (BMI) 35.0-35.9, adult: Secondary | ICD-10-CM | POA: Diagnosis not present

## 2023-02-17 DIAGNOSIS — Z91128 Patient's intentional underdosing of medication regimen for other reason: Secondary | ICD-10-CM | POA: Diagnosis not present

## 2023-02-17 DIAGNOSIS — I63233 Cerebral infarction due to unspecified occlusion or stenosis of bilateral carotid arteries: Secondary | ICD-10-CM | POA: Diagnosis not present

## 2023-02-17 DIAGNOSIS — F17210 Nicotine dependence, cigarettes, uncomplicated: Secondary | ICD-10-CM | POA: Diagnosis not present

## 2023-02-17 DIAGNOSIS — Z7902 Long term (current) use of antithrombotics/antiplatelets: Secondary | ICD-10-CM | POA: Diagnosis not present

## 2023-02-17 DIAGNOSIS — Z90710 Acquired absence of both cervix and uterus: Secondary | ICD-10-CM | POA: Diagnosis not present

## 2023-02-17 DIAGNOSIS — R29810 Facial weakness: Secondary | ICD-10-CM | POA: Diagnosis not present

## 2023-02-17 DIAGNOSIS — R112 Nausea with vomiting, unspecified: Secondary | ICD-10-CM | POA: Diagnosis not present

## 2023-02-17 DIAGNOSIS — Z8261 Family history of arthritis: Secondary | ICD-10-CM | POA: Diagnosis not present

## 2023-02-17 DIAGNOSIS — Z803 Family history of malignant neoplasm of breast: Secondary | ICD-10-CM | POA: Diagnosis not present

## 2023-02-17 DIAGNOSIS — Z886 Allergy status to analgesic agent status: Secondary | ICD-10-CM | POA: Diagnosis not present

## 2023-02-17 DIAGNOSIS — I700 Atherosclerosis of aorta: Secondary | ICD-10-CM | POA: Diagnosis not present

## 2023-02-17 DIAGNOSIS — Z880 Allergy status to penicillin: Secondary | ICD-10-CM | POA: Diagnosis not present

## 2023-02-17 DIAGNOSIS — H5500 Unspecified nystagmus: Secondary | ICD-10-CM | POA: Diagnosis not present

## 2023-02-17 DIAGNOSIS — E7800 Pure hypercholesterolemia, unspecified: Secondary | ICD-10-CM | POA: Diagnosis not present

## 2023-02-17 DIAGNOSIS — I63441 Cerebral infarction due to embolism of right cerebellar artery: Secondary | ICD-10-CM | POA: Diagnosis not present

## 2023-02-17 DIAGNOSIS — R29818 Other symptoms and signs involving the nervous system: Secondary | ICD-10-CM | POA: Diagnosis not present

## 2023-02-17 DIAGNOSIS — Z8249 Family history of ischemic heart disease and other diseases of the circulatory system: Secondary | ICD-10-CM | POA: Diagnosis not present

## 2023-02-17 DIAGNOSIS — I10 Essential (primary) hypertension: Secondary | ICD-10-CM | POA: Diagnosis not present

## 2023-02-17 DIAGNOSIS — F1410 Cocaine abuse, uncomplicated: Secondary | ICD-10-CM | POA: Diagnosis not present

## 2023-02-17 LAB — COMPREHENSIVE METABOLIC PANEL
ALT: 46 U/L — ABNORMAL HIGH (ref 0–44)
AST: 33 U/L (ref 15–41)
Albumin: 4.1 g/dL (ref 3.5–5.0)
Alkaline Phosphatase: 78 U/L (ref 38–126)
Anion gap: 11 (ref 5–15)
BUN: 24 mg/dL — ABNORMAL HIGH (ref 8–23)
CO2: 23 mmol/L (ref 22–32)
Calcium: 9.2 mg/dL (ref 8.9–10.3)
Chloride: 102 mmol/L (ref 98–111)
Creatinine, Ser: 1.05 mg/dL — ABNORMAL HIGH (ref 0.44–1.00)
GFR, Estimated: >60 mL/min (ref >60)
Glucose, Bld: 98 mg/dL (ref 70–99)
Potassium: 4 mmol/L (ref 3.5–5.1)
Sodium: 136 mmol/L (ref 135–145)
Total Bilirubin: 1 mg/dL (ref <1.2)
Total Protein: 8.8 g/dL — ABNORMAL HIGH (ref 6.5–8.1)

## 2023-02-17 LAB — CBC
HCT: 43.6 % (ref 36.0–46.0)
Hemoglobin: 14.3 g/dL (ref 12.0–15.0)
MCH: 26.1 pg (ref 26.0–34.0)
MCHC: 32.8 g/dL (ref 30.0–36.0)
MCV: 79.6 fL — ABNORMAL LOW (ref 80.0–100.0)
Platelets: 329 10*3/uL (ref 150–400)
RBC: 5.48 MIL/uL — ABNORMAL HIGH (ref 3.87–5.11)
RDW: 15.8 % — ABNORMAL HIGH (ref 11.5–15.5)
WBC: 11.7 10*3/uL — ABNORMAL HIGH (ref 4.0–10.5)
nRBC: 0 % (ref 0.0–0.2)

## 2023-02-17 LAB — ECHOCARDIOGRAM COMPLETE
AR max vel: 2.45 cm2
AV Area VTI: 2.1 cm2
AV Area mean vel: 2 cm2
AV Mean grad: 4 mm[Hg]
AV Peak grad: 8.9 mm[Hg]
Ao pk vel: 1.49 m/s
Area-P 1/2: 2.99 cm2
Height: 65 in
S' Lateral: 2.5 cm
Weight: 3439.18 [oz_av]

## 2023-02-17 LAB — LIPID PANEL
Cholesterol: 235 mg/dL — ABNORMAL HIGH (ref 0–200)
HDL: 50 mg/dL (ref >40)
LDL Cholesterol: 160 mg/dL — ABNORMAL HIGH (ref 0–99)
Total CHOL/HDL Ratio: 4.7 {ratio}
Triglycerides: 123 mg/dL (ref <150)
VLDL: 25 mg/dL (ref 0–40)

## 2023-02-17 LAB — URINALYSIS, ROUTINE W REFLEX MICROSCOPIC
Bilirubin Urine: NEGATIVE
Glucose, UA: NEGATIVE mg/dL
Ketones, ur: NEGATIVE mg/dL
Nitrite: POSITIVE — AB
Protein, ur: 30 mg/dL — AB
Specific Gravity, Urine: 1.02 (ref 1.005–1.030)
pH: 5 (ref 5.0–8.0)

## 2023-02-17 LAB — RAPID URINE DRUG SCREEN, HOSP PERFORMED
Amphetamines: NOT DETECTED
Barbiturates: NOT DETECTED
Benzodiazepines: NOT DETECTED
Cocaine: POSITIVE — AB
Opiates: NOT DETECTED
Tetrahydrocannabinol: NOT DETECTED

## 2023-02-17 LAB — TROPONIN I (HIGH SENSITIVITY): Troponin I (High Sensitivity): 9 ng/L (ref <18)

## 2023-02-17 LAB — CREATININE, SERUM
Creatinine, Ser: 0.85 mg/dL (ref 0.44–1.00)
GFR, Estimated: >60 mL/min (ref >60)

## 2023-02-17 LAB — ETHANOL: Alcohol, Ethyl (B): <10 mg/dL (ref <10)

## 2023-02-17 LAB — HIV ANTIBODY (ROUTINE TESTING W REFLEX): HIV Screen 4th Generation wRfx: NONREACTIVE

## 2023-02-17 MED ORDER — AMLODIPINE BESYLATE 5 MG PO TABS
5.0000 mg | ORAL_TABLET | Freq: Every day | ORAL | Status: DC
Start: 2023-02-17 — End: 2023-02-20
  Administered 2023-02-17 – 2023-02-19 (×3): 5 mg via ORAL
  Filled 2023-02-17 (×3): qty 1

## 2023-02-17 MED ORDER — CLOPIDOGREL BISULFATE 75 MG PO TABS
75.0000 mg | ORAL_TABLET | Freq: Every day | ORAL | Status: DC
  Administered 2023-02-17 – 2023-02-20 (×4): 75 mg via ORAL
  Filled 2023-02-17 (×5): qty 1

## 2023-02-17 MED ORDER — STROKE: EARLY STAGES OF RECOVERY BOOK
Freq: Once | Status: AC
  Filled 2023-02-17: qty 1

## 2023-02-17 MED ORDER — ENOXAPARIN SODIUM 40 MG/0.4ML IJ SOSY
40.0000 mg | PREFILLED_SYRINGE | Freq: Every day | INTRAMUSCULAR | Status: DC
  Administered 2023-02-17 – 2023-02-20 (×4): 40 mg via SUBCUTANEOUS
  Filled 2023-02-17 (×4): qty 0.4

## 2023-02-17 MED ORDER — PROCHLORPERAZINE EDISYLATE 10 MG/2ML IJ SOLN
5.0000 mg | Freq: Four times a day (QID) | INTRAMUSCULAR | Status: DC | PRN
  Administered 2023-02-17 – 2023-02-20 (×3): 5 mg via INTRAVENOUS
  Filled 2023-02-17 (×3): qty 2

## 2023-02-17 MED ORDER — IOHEXOL 350 MG/ML SOLN
75.0000 mL | Freq: Once | INTRAVENOUS | Status: AC | PRN
  Administered 2023-02-17: 75 mL via INTRAVENOUS

## 2023-02-17 MED ORDER — ATORVASTATIN CALCIUM 80 MG PO TABS
80.0000 mg | ORAL_TABLET | Freq: Every day | ORAL | Status: DC
  Administered 2023-02-17 – 2023-02-20 (×4): 80 mg via ORAL
  Filled 2023-02-17 (×2): qty 1
  Filled 2023-02-17: qty 2
  Filled 2023-02-17: qty 1

## 2023-02-17 MED ORDER — ACETAMINOPHEN 325 MG PO TABS
650.0000 mg | ORAL_TABLET | Freq: Four times a day (QID) | ORAL | Status: DC | PRN
  Administered 2023-02-17 – 2023-02-18 (×2): 650 mg via ORAL
  Filled 2023-02-17 (×2): qty 2

## 2023-02-17 NOTE — Plan of Care (Signed)
Problem: Education: Goal: Knowledge of disease or condition will improve 02/17/2023 1815 by Ellison Carwin, RN Outcome: Progressing 02/17/2023 1814 by Ellison Carwin, RN Outcome: Progressing Goal: Knowledge of secondary prevention will improve (MUST DOCUMENT ALL) 02/17/2023 1815 by Ellison Carwin, RN Outcome: Progressing 02/17/2023 1814 by Ellison Carwin, RN Outcome: Progressing Goal: Knowledge of patient specific risk factors will improve Loraine Leriche N/A or DELETE if not current risk factor) 02/17/2023 1815 by Ellison Carwin, RN Outcome: Progressing 02/17/2023 1814 by Ellison Carwin, RN Outcome: Progressing   Problem: Ischemic Stroke/TIA Tissue Perfusion: Goal: Complications of ischemic stroke/TIA will be minimized 02/17/2023 1815 by Ellison Carwin, RN Outcome: Progressing 02/17/2023 1814 by Ellison Carwin, RN Outcome: Progressing   Problem: Coping: Goal: Will verbalize positive feelings about self 02/17/2023 1815 by Ellison Carwin, RN Outcome: Progressing 02/17/2023 1814 by Ellison Carwin, RN Outcome: Progressing Goal: Will identify appropriate support needs 02/17/2023 1815 by Ellison Carwin, RN Outcome: Progressing 02/17/2023 1814 by Ellison Carwin, RN Outcome: Progressing   Problem: Health Behavior/Discharge Planning: Goal: Ability to manage health-related needs will improve 02/17/2023 1815 by Ellison Carwin, RN Outcome: Progressing 02/17/2023 1814 by Ellison Carwin, RN Outcome: Progressing Goal: Goals will be collaboratively established with patient/family 02/17/2023 1815 by Ellison Carwin, RN Outcome: Progressing 02/17/2023 1814 by Ellison Carwin, RN Outcome: Progressing   Problem: Self-Care: Goal: Ability to participate in self-care as condition permits will improve 02/17/2023 1815 by Ellison Carwin, RN Outcome: Progressing 02/17/2023 1814 by Ellison Carwin, RN Outcome: Progressing Goal: Verbalization of feelings and  concerns over difficulty with self-care will improve 02/17/2023 1815 by Ellison Carwin, RN Outcome: Progressing 02/17/2023 1814 by Ellison Carwin, RN Outcome: Progressing Goal: Ability to communicate needs accurately will improve 02/17/2023 1815 by Ellison Carwin, RN Outcome: Progressing 02/17/2023 1814 by Ellison Carwin, RN Outcome: Progressing   Problem: Nutrition: Goal: Risk of aspiration will decrease 02/17/2023 1815 by Ellison Carwin, RN Outcome: Progressing 02/17/2023 1814 by Ellison Carwin, RN Outcome: Progressing Goal: Dietary intake will improve 02/17/2023 1815 by Ellison Carwin, RN Outcome: Progressing 02/17/2023 1814 by Ellison Carwin, RN Outcome: Progressing   Problem: Education: Goal: Knowledge of General Education information will improve Description: Including pain rating scale, medication(s)/side effects and non-pharmacologic comfort measures 02/17/2023 1815 by Ellison Carwin, RN Outcome: Progressing 02/17/2023 1814 by Ellison Carwin, RN Outcome: Progressing   Problem: Health Behavior/Discharge Planning: Goal: Ability to manage health-related needs will improve 02/17/2023 1815 by Ellison Carwin, RN Outcome: Progressing 02/17/2023 1814 by Ellison Carwin, RN Outcome: Progressing   Problem: Clinical Measurements: Goal: Ability to maintain clinical measurements within normal limits will improve 02/17/2023 1815 by Ellison Carwin, RN Outcome: Progressing 02/17/2023 1814 by Ellison Carwin, RN Outcome: Progressing Goal: Will remain free from infection 02/17/2023 1815 by Ellison Carwin, RN Outcome: Progressing 02/17/2023 1814 by Ellison Carwin, RN Outcome: Progressing Goal: Diagnostic test results will improve 02/17/2023 1815 by Ellison Carwin, RN Outcome: Progressing 02/17/2023 1814 by Ellison Carwin, RN Outcome: Progressing Goal: Respiratory complications will improve 02/17/2023 1815 by Ellison Carwin, RN Outcome:  Progressing 02/17/2023 1814 by Ellison Carwin, RN Outcome: Progressing Goal: Cardiovascular complication will be avoided 02/17/2023 1815 by Ellison Carwin, RN Outcome: Progressing 02/17/2023 1814 by Ellison Carwin, RN Outcome: Progressing   Problem: Activity: Goal: Risk for activity intolerance will decrease 02/17/2023 1815 by Ellison Carwin, RN Outcome: Progressing 02/17/2023 1814 by Ellison Carwin, RN Outcome: Progressing   Problem: Nutrition: Goal: Adequate nutrition will be maintained 02/17/2023 1815 by Ellison Carwin, RN Outcome: Progressing 02/17/2023 1814 by Ellison Carwin, RN Outcome: Progressing   Problem: Coping: Goal: Level of  anxiety will decrease 02/17/2023 1815 by Ellison Carwin, RN Outcome: Progressing 02/17/2023 1814 by Ellison Carwin, RN Outcome: Progressing   Problem: Elimination: Goal: Will not experience complications related to bowel motility 02/17/2023 1815 by Ellison Carwin, RN Outcome: Progressing 02/17/2023 1814 by Ellison Carwin, RN Outcome: Progressing Goal: Will not experience complications related to urinary retention 02/17/2023 1815 by Ellison Carwin, RN Outcome: Progressing 02/17/2023 1814 by Ellison Carwin, RN Outcome: Progressing   Problem: Pain Management: Goal: General experience of comfort will improve 02/17/2023 1815 by Ellison Carwin, RN Outcome: Progressing 02/17/2023 1814 by Ellison Carwin, RN Outcome: Progressing   Problem: Safety: Goal: Ability to remain free from injury will improve 02/17/2023 1815 by Ellison Carwin, RN Outcome: Progressing 02/17/2023 1814 by Ellison Carwin, RN Outcome: Progressing   Problem: Skin Integrity: Goal: Risk for impaired skin integrity will decrease 02/17/2023 1815 by Ellison Carwin, RN Outcome: Progressing 02/17/2023 1814 by Ellison Carwin, RN Outcome: Progressing

## 2023-02-17 NOTE — H&P (Addendum)
History and Physical  Lindsey Mercer WUJ:[id number] DOB: 1960/09/28 DOA: 02/16/2023  Referring physician: Dr. Wilkie Aye, EDP  PCP: Pcp, No  Outpatient Specialists: None. Patient coming from: Home.  Chief Complaint: Headache, nausea and vomiting x 4 days.  HPI: Lindsey Mercer is a 62 y.o. female with medical history significant for hypertension, not taking her BP medications, hyperlipidemia, obesity, degenerative joint disease, who presented to the ED via EMS from home with complaints of intermittent right-sided headache, nausea, and vomiting for the past 4 days.  Associated with inability to walk on her own since Thursday, 4 days ago, elevated blood pressures, which prompted her to call EMS.  While in the lobby, waiting to be seen in triage, she developed chest pressure sensation.  Last known well at least 4 days ago.  In the ED, hypertensive with ataxia.  MRI brain without contrast revealed large acute infarct of the inferior right cerebellar hemisphere with mass effect within the posterior fossa and narrowing of the fourth ventricle inferiorly.  No hydrocephalus.  Small amount of acute ischemia within the medial left cerebellum.  Numerous chronic microhemorrhages, predominantly central likely chronic hypertensive angiopathy.  Neurology/stroke team was consulted and presented at bedside and listed on hospital.  Recommended admission for stroke workup.  Admitted by [facility], hospitalist service.  ED Course: Temperature 98.7.  BP 140/82.  Pulse 81, respiratory rate 20, O2 saturation 100% on room air.  Review of Systems: Review of systems as noted in the HPI. All other systems reviewed and are negative.   Past Medical History:  Diagnosis Date   Allergy    Arthritis    DJD (degenerative joint disease)    Hypertension    Past Surgical History:  Procedure Laterality Date   ABDOMINAL HYSTERECTOMY     1993    Social History:  reports that she has quit smoking. She has never used smokeless tobacco.  She reports current alcohol use. She reports that she does not use drugs.   Allergies  Allergen Reactions   Aspirin Hives and Nausea Only   Lisinopril     REACTION: Dry cough   Penicillins     REACTION: hives   Sulfa Antibiotics Hives    Family History  Problem Relation Age of Onset   Arthritis Mother    Breast cancer Mother    Hypertension Mother    Cancer Paternal Aunt        breast   Arthritis Father    Hypertension Father    Arthritis Other    Breast cancer Sister    Hypertension Sister       Prior to Admission medications   Medication Sig Start Date End Date Taking? Authorizing Provider  cloNIDine (CATAPRES) 0.1 MG tablet Take 1 tablet (0.1 mg total) by mouth 2 (two) times daily. 12/04/21   Crain, Whitney L, PA  rizatriptan (MAXALT-MLT) 5 MG disintegrating tablet Take 1 tablet (5 mg total) by mouth as needed for migraine. May repeat in 2 hours if needed 12/04/21   Maretta Bees, [place]    Physical Exam: BP (!) 140/82   Pulse 81   Temp 98.7 F (37.1 C) (Oral)   Resp 20   Ht 5\' 5"  (1.651 m)   Wt 97.5 kg   SpO2 100%   BMI 35.77 kg/m   General: 62 y.o. year-old female well developed well nourished in no acute distress.  Alert and oriented x3. Cardiovascular: Regular rate and rhythm with no rubs or gallops.  No thyromegaly or JVD noted.  No  lower extremity edema. 2/4 pulses in all 4 extremities. Respiratory: Clear to auscultation with no wheezes or rales. Good inspiratory effort. Abdomen: Soft nontender nondistended with normal bowel sounds x4 quadrants. Muskuloskeletal: No cyanosis, clubbing or edema noted bilaterally Neuro: CN II-XII intact, strength, sensation, reflexes Skin: No ulcerative lesions noted or rashes Psychiatry: Judgement and insight appear normal. Mood is appropriate for condition and setting          Labs on Admission:  Basic Metabolic Panel: Recent Labs  Lab 02/16/23 1717 02/16/23 2315  NA 139 136  K 3.9 4.0  CL 106 102  CO2 19* 23   GLUCOSE 115* 98  BUN 21 24*  CREATININE 0.89 1.05*  CALCIUM 9.2 9.2   Liver Function Tests: Recent Labs  Lab 02/16/23 2315  AST 33  ALT 46*  ALKPHOS 78  BILITOT 1.0  PROT 8.8*  ALBUMIN 4.1   No results for input(s): "LIPASE", "AMYLASE" in the last 168 hours. No results for input(s): "AMMONIA" in the last 168 hours. CBC: Recent Labs  Lab 02/16/23 1717 02/16/23 2315  WBC 11.1*  --   NEUTROABS  --  9.8*  HGB 15.6*  --   HCT 48.5*  --   MCV 79.5*  --   PLT 372  --    Cardiac Enzymes: No results for input(s): "CKTOTAL", "CKMB", "CKMBINDEX", "TROPONINI" in the last 168 hours.  BNP (last 3 results) No results for input(s): "BNP" in the last 8760 hours.  ProBNP (last 3 results) No results for input(s): "PROBNP" in the last 8760 hours.  CBG: No results for input(s): "GLUCAP" in the last 168 hours.  Radiological Exams on Admission: CT ANGIO HEAD NECK W WO CM Result Date: 02/17/2023 CLINICAL DATA:  Acute neurologic deficit EXAM: CT ANGIOGRAPHY HEAD AND NECK WITH AND WITHOUT CONTRAST TECHNIQUE: Multidetector CT imaging of the head and neck was performed using the standard protocol during bolus administration of intravenous contrast. Multiplanar CT image reconstructions and MIPs were obtained to evaluate the vascular anatomy. Carotid stenosis measurements (when applicable) are obtained utilizing NASCET criteria, using the distal internal carotid diameter as the denominator. RADIATION DOSE REDUCTION: This exam was performed according to the departmental dose-optimization program which includes automated exposure control, adjustment of the mA and/or kV according to patient size and/or use of iterative reconstruction technique. CONTRAST:  75mL OMNIPAQUE IOHEXOL 350 MG/ML SOLN COMPARISON:  Same day brain MRI FINDINGS: CTA NECK FINDINGS Skeleton: No acute abnormality or high grade bony spinal canal stenosis. Other neck: Normal pharynx, larynx and major salivary glands. No cervical  lymphadenopathy. Unremarkable thyroid gland. Upper chest: No pneumothorax or pleural effusion. No nodules or masses. Aortic arch: There is calcific atherosclerosis of the aortic arch. Normal variant aortic arch branching pattern with the brachiocephalic and left common carotid arteries sharing a common origin. RIGHT carotid system: No dissection, occlusion or aneurysm. Mild atherosclerotic calcification at the carotid bifurcation without hemodynamically significant stenosis. LEFT carotid system: No dissection, occlusion or aneurysm. There is mixed density atherosclerosis extending into the proximal ICA, resulting in less than 50% stenosis. Vertebral arteries: Right dominant configuration. There is no dissection, occlusion or flow-limiting stenosis to the skull base (V1-V3 segments). CTA HEAD FINDINGS POSTERIOR CIRCULATION: Vertebral arteries are normal. 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 Basilar artery is normal. Superior cerebellar arteries are normal. Posterior cerebral arteries are normal. ANTERIOR CIRCULATION: Right-greater-than-left atherosclerotic calcification with moderate stenosis of the right clinoid segment. Anterior cerebral arteries are normal. Middle cerebral arteries are normal. Venous sinuses: As permitted by contrast  timing, patent. Anatomic variants: None Review of the MIP images confirms the above findings. 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 3. Bilateral carotid bifurcation atherosclerosis without hemodynamically significant stenosis by NASCET criteria. Aortic Atherosclerosis (ICD10-I70.0). Electronically Signed   By: Deatra Robinson M.D.   On: 02/17/2023 02:50   MR BRAIN WO CONTRAST Result Date: 02/17/2023 CLINICAL DATA:  Nausea and vomiting EXAM: MRI HEAD WITHOUT CONTRAST TECHNIQUE: Multiplanar, multiecho pulse sequences of the brain and surrounding structures were obtained without intravenous contrast. COMPARISON:  Head CT 02/16/2023 FINDINGS:  Brain: There is a large acute infarct of the inferior right cerebellar hemisphere. Small amount of acute ischemia within the medial left cerebellum. No supratentorial diffusion abnormality. Numerous chronic microhemorrhages, predominantly central. There is mass effect within the posterior fossa with narrowing of the fourth ventricle inferiorly. No hydrocephalus. There is confluent hyperintense T2-weighted signal within the white matter. Generalized volume loss. The midline structures are normal. Vascular: Normal flow voids. Skull and upper cervical spine: Normal calvarium and skull base. Visualized upper cervical spine and soft tissues are normal. Sinuses/Orbits:No paranasal sinus fluid levels or advanced mucosal thickening. No mastoid or middle ear effusion. Normal orbits. IMPRESSION: 1. Large acute infarct of the inferior right cerebellar hemisphere with mass effect within the posterior fossa and narrowing of the fourth ventricle inferiorly. No hydrocephalus. 2. Small amount of acute ischemia within the medial left cerebellum. 3. Numerous chronic microhemorrhages, predominantly central, likely chronic hypertensive angiopathy Electronically Signed   By: Deatra Robinson M.D.   On: 02/17/2023 00:59   CT Head Wo Contrast Result Date: 02/16/2023 CLINICAL DATA:  Headache EXAM: CT HEAD WITHOUT CONTRAST TECHNIQUE: Contiguous axial images were obtained from the base of the skull through the vertex without intravenous contrast. RADIATION DOSE REDUCTION: This exam was performed according to the departmental dose-optimization program which includes automated exposure control, adjustment of the mA and/or kV according to patient size and/or use of iterative reconstruction technique. COMPARISON:  None Available. FINDINGS: Brain: There is an acute/early subacute infarct of the inferior medial right cerebellum. Chronic ischemic white matter changes. Old right caudate head small vessel infarct. Vascular: Atherosclerotic  calcification of the internal carotid arteries at the skull base. No abnormal hyperdensity of the major intracranial arteries or dural venous sinuses. Skull: The visualized skull base, calvarium and extracranial soft tissues are normal. Sinuses/Orbits: No fluid levels or advanced mucosal thickening of the visualized paranasal sinuses. No mastoid or middle ear effusion. Normal orbits. Other: None. 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 No hemorrhage or mass effect. Electronically Signed   By: Deatra Robinson M.D.   On: 02/16/2023 23:07   DG Chest 2 View Result Date: 02/16/2023 CLINICAL DATA:  chest pain EXAM: CHEST - 2 VIEW COMPARISON:  02/26/2008. FINDINGS: The heart size and mediastinal contours are within normal limits. Both lungs are clear. No pneumothorax or pleural effusion. There are thoracic degenerative changes. IMPRESSION: No acute cardiopulmonary disease. Electronically Signed   By: Layla Maw M.D.   On: 02/16/2023 18:25    EKG: I independently viewed the EKG done and my findings are as followed: Sinus rhythm 82 with premature supraventricular complexes.  QTc 459.  Assessment/Plan Present on Admission:  Acute CVA (cerebrovascular accident) (HCC)  Principal Problem:   Acute CVA (cerebrovascular accident) (HCC)  Large right cerebellar subacute infarct Presented with gait ataxia, nausea vomiting Seen on MRI: Large acute infarct of the inferior right cerebellar hemisphere with mass effect within  the posterior fossa and narrowing of the fourth ventricle inferiorly.  Small amount of acute ischemia within the medial left cerebellum.  Numerous chronic microhemorrhages, predominantly central likely chronic hypertensive angiopathy. Seen by neurology/stroke team No need for permissive hypertension, last known well more than 4 days ago. Goal blood pressure normotensive at discharge. Follow 2D echo/A1c/fasting lipid panel. CT angio head and neck The  patient is allergic to aspirin, started on Plavix 75 mg daily. PT/OT/speech therapy evaluation As needed antiemetics As needed antihypertensive with parameters  Hypertension, uncontrolled Likely secondary to medication noncompliance Not on oral antihypertensives for the past 4 months Prior to admission on clonidine, held off. Norvasc started Closely monitor vital signs  Hyperlipidemia High intensity statin Goal LDL less than 70  Falls PT OT evaluation Fall precautions   Time: 75 minutes.   DVT prophylaxis: Subcu Lovenox daily  Code Status: Full code  Family Communication: None at bedside.  Disposition Plan: Admitted to progressive care unit.  Consults called: Neurology/stroke team.  PT/OT/speech therapy.  Admission status: Inpatient status.   Status is: Inpatient The patient requires at least 2 midnights for further evaluation and treatment of present condition.   Darlin Drop MD [facility] Pager [phone]  If 7PM-7AM, please contact night-coverage [email or web address] Password [email or web address]  02/17/2023, 6:30 AM

## 2023-02-17 NOTE — Progress Notes (Signed)
  Echocardiogram 2D Echocardiogram has been performed.  Lindsey Mercer 02/17/2023, 12:54 PM

## 2023-02-17 NOTE — Progress Notes (Signed)
STROKE TEAM PROGRESS NOTE   SUBJECTIVE (INTERVAL HISTORY) Her daughter is at the bedside.  Overall her condition is stable. She denies any HA or nausea at this time. However, she stated once she sits up she still feel dizzy.    OBJECTIVE Temp:  [97.9 F (36.6 C)-98.7 F (37.1 C)] 98.7 F (37.1 C) (12/30 2000) Pulse Rate:  [44-90] 70 (12/30 2000) Cardiac Rhythm: Normal sinus rhythm (12/30 1900) Resp:  [14-20] 17 (12/30 2000) BP: (132-189)/(75-127) 135/75 (12/30 2000) SpO2:  [98 %-100 %] 100 % (12/30 2000)  No results for input(s): "GLUCAP" in the last 168 hours. Recent Labs  Lab 02/16/23 1717 02/16/23 2315 02/17/23 0630  NA 139 136  --   K 3.9 4.0  --   CL 106 102  --   CO2 19* 23  --   GLUCOSE 115* 98  --   BUN 21 24*  --   CREATININE 0.89 1.05* 0.85  CALCIUM 9.2 9.2  --    Recent Labs  Lab 02/16/23 2315  AST 33  ALT 46*  ALKPHOS 78  BILITOT 1.0  PROT 8.8*  ALBUMIN 4.1   Recent Labs  Lab 02/16/23 1717 02/16/23 2315 02/17/23 0630  WBC 11.1*  --  11.7*  NEUTROABS  --  9.8*  --   HGB 15.6*  --  14.3  HCT 48.5*  --  43.6  MCV 79.5*  --  79.6*  PLT 372  --  329   No results for input(s): "CKTOTAL", "CKMB", "CKMBINDEX", "TROPONINI" in the last 168 hours. Recent Labs    02/16/23 2315  LABPROT 13.8  INR 1.0   Recent Labs    02/16/23 2341  COLORURINE AMBER*  LABSPEC 1.020  PHURINE 5.0  GLUCOSEU NEGATIVE  HGBUR SMALL*  BILIRUBINUR NEGATIVE  KETONESUR NEGATIVE  PROTEINUR 30*  NITRITE POSITIVE*  LEUKOCYTESUR TRACE*       Component Value Date/Time   CHOL 235 (H) 02/17/2023 0515   TRIG 123 02/17/2023 0515   HDL 50 02/17/2023 0515   CHOLHDL 4.7 02/17/2023 0515   VLDL 25 02/17/2023 0515   LDLCALC 160 (H) 02/17/2023 0515   No results found for: "HGBA1C"    Component Value Date/Time   LABOPIA NONE DETECTED 02/16/2023 2341   COCAINSCRNUR POSITIVE (A) 02/16/2023 2341   LABBENZ NONE DETECTED 02/16/2023 2341   AMPHETMU NONE DETECTED 02/16/2023 2341    THCU NONE DETECTED 02/16/2023 2341   LABBARB NONE DETECTED 02/16/2023 2341    Recent Labs  Lab 02/16/23 2315  ETH <10    I have personally reviewed the radiological images below and agree with the radiology interpretations.  ECHOCARDIOGRAM COMPLETE Result Date: 02/17/2023    ECHOCARDIOGRAM REPORT   Patient Name:   JOVIE SEIGEL Date of Exam: 02/17/2023 Medical Rec #:  [id number]      Height:       65.0 in Accession #:    [id number]     Weight:       214.9 lb Date of Birth:  1960-09-03      BSA:          2.040 m Patient Age:    62 years       BP:           148/98 mmHg Patient Gender: F              HR:           73 bpm. Exam Location:  Inpatient Procedure: 2D Echo, Cardiac Doppler and Color Doppler  Indications:    Stroke  History:        Patient has no prior history of Echocardiogram examinations.                 Risk Factors:Hypertension and Former Smoker.  Sonographer:    Karma Ganja Referring Phys: [id number] ASHISH ARORA IMPRESSIONS  1. Left ventricular ejection fraction, by estimation, is 60 to 65%. The left ventricle has normal function. The left ventricle has no regional wall motion abnormalities. There is severe concentric left ventricular hypertrophy. Left ventricular diastolic  parameters are consistent with Grade I diastolic dysfunction (impaired relaxation). Elevated left ventricular end-diastolic pressure.  2. Right ventricular systolic function is normal. The right ventricular size is normal. There is normal pulmonary artery systolic pressure.  3. The mitral valve is normal in structure. No evidence of mitral valve regurgitation. No evidence of mitral stenosis.  4. The aortic valve is tricuspid. There is mild calcification of the aortic valve. Aortic valve regurgitation is not visualized. No aortic stenosis is present.  5. The inferior vena cava is normal in size with greater than 50% respiratory variability, suggesting right atrial pressure of 3 mmHg. FINDINGS  Left Ventricle: Left  ventricular ejection fraction, by estimation, is 60 to 65%. The left ventricle has normal function. The left ventricle has no regional wall motion abnormalities. The left ventricular internal cavity size was normal in size. There is  severe concentric left ventricular hypertrophy. Left ventricular diastolic parameters are consistent with Grade I diastolic dysfunction (impaired relaxation). Elevated left ventricular end-diastolic pressure. Right Ventricle: The right ventricular size is normal. No increase in right ventricular wall thickness. Right ventricular systolic function is normal. There is normal pulmonary artery systolic pressure. The tricuspid regurgitant velocity is 1.89 m/s, and  with an assumed right atrial pressure of 3 mmHg, the estimated right ventricular systolic pressure is 17.3 mmHg. Left Atrium: Left atrial size was normal in size. Right Atrium: Right atrial size was normal in size. Pericardium: There is no evidence of pericardial effusion. Mitral Valve: The mitral valve is normal in structure. No evidence of mitral valve regurgitation. No evidence of mitral valve stenosis. Tricuspid Valve: The tricuspid valve is normal in structure. Tricuspid valve regurgitation is trivial. No evidence of tricuspid stenosis. Aortic Valve: The aortic valve is tricuspid. There is mild calcification of the aortic valve. Aortic valve regurgitation is not visualized. No aortic stenosis is present. Aortic valve mean gradient measures 4.0 mmHg. Aortic valve peak gradient measures 8.9 mmHg. Aortic valve area, by VTI measures 2.10 cm. Pulmonic Valve: The pulmonic valve was normal in structure. Pulmonic valve regurgitation is not visualized. No evidence of pulmonic stenosis. Aorta: The aortic root is normal in size and structure. Venous: The inferior vena cava is normal in size with greater than 50% respiratory variability, suggesting right atrial pressure of 3 mmHg. IAS/Shunts: No atrial level shunt detected by color flow  Doppler.  LEFT VENTRICLE PLAX 2D LVIDd:         3.90 cm   Diastology LVIDs:         2.50 cm   LV e' medial:    3.26 cm/s LV PW:         1.80 cm   LV E/e' medial:  15.0 LV IVS:        1.55 cm   LV e' lateral:   4.03 cm/s LVOT diam:     2.00 cm   LV E/e' lateral: 12.1 LV SV:  39 LV SV Index:   19 LVOT Area:     3.14 cm  RIGHT VENTRICLE             IVC RV Basal diam:  3.80 cm     IVC diam: 1.30 cm RV S prime:     17.10 cm/s TAPSE (M-mode): 1.9 cm LEFT ATRIUM             Index        RIGHT ATRIUM           Index LA diam:        4.80 cm 2.35 cm/m   RA Area:     19.10 cm LA Vol (A2C):   20.8 ml 10.20 ml/m  RA Volume:   50.20 ml  24.61 ml/m LA Vol (A4C):   43.2 ml 21.18 ml/m LA Biplane Vol: 31.9 ml 15.64 ml/m  AORTIC VALVE AV Area (Vmax):    2.45 cm AV Area (Vmean):   2.00 cm AV Area (VTI):     2.10 cm AV Vmax:           149.00 cm/s AV Vmean:          88.200 cm/s AV VTI:            0.184 m AV Peak Grad:      8.9 mmHg AV Mean Grad:      4.0 mmHg LVOT Vmax:         116.00 cm/s LVOT Vmean:        56.200 cm/s LVOT VTI:          0.123 m LVOT/AV VTI ratio: 0.67  AORTA Ao Root diam: 3.00 cm Ao Asc diam:  3.40 cm MITRAL VALVE               TRICUSPID VALVE MV Area (PHT): 2.99 cm    TR Peak grad:   14.3 mmHg MV Decel Time: 254 msec    TR Vmax:        189.00 cm/s MV E velocity: 48.80 cm/s MV A velocity: 61.70 cm/s  SHUNTS MV E/A ratio:  0.79        Systemic VTI:  0.12 m                            Systemic Diam: 2.00 cm Chilton Si MD Electronically signed by Chilton Si MD Signature Date/Time: 02/17/2023/4:48:36 PM    Final    CT HEAD WO CONTRAST ( ) Result Date: 02/17/2023 CLINICAL DATA:  Stroke, follow-up. EXAM: CT HEAD WITHOUT CONTRAST TECHNIQUE: Contiguous axial images were obtained from the base of the skull through the vertex without intravenous contrast. RADIATION DOSE REDUCTION: This exam was performed according to the departmental dose-optimization program which includes automated exposure  control, adjustment of the mA and/or kV according to patient size and/or use of iterative reconstruction technique. COMPARISON:  MRI brain 02/17/2023.  Head CT 02/16/2023. FINDINGS: Brain: Evolving large acute infarct in the medial aspect of the right cerebellar hemisphere. No acute hemorrhage. Slightly increased mass effect on the inferior fourth ventricle. No acute hydrocephalus. 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 No extra-axial collection or midline shift. Vascular: No hyperdense vessel or unexpected calcification. Skull: No calvarial fracture or suspicious bone lesion. Skull base is unremarkable. Sinuses/Orbits: No acute findings. Other: None. IMPRESSION: Evolving large acute infarct in the medial aspect of the right cerebellar hemisphere. No acute hemorrhage. Slightly  increased mass effect on the inferior fourth ventricle. No acute hydrocephalus. Electronically Signed   By: Orvan Falconer M.D.   On: 02/17/2023 09:58   CT ANGIO HEAD NECK W WO CM Result Date: 02/17/2023 CLINICAL DATA:  Acute neurologic deficit EXAM: CT ANGIOGRAPHY HEAD AND NECK WITH AND WITHOUT CONTRAST TECHNIQUE: Multidetector CT imaging of the head and neck was performed using the standard protocol during bolus administration of intravenous contrast. Multiplanar CT image reconstructions and MIPs were obtained to evaluate the vascular anatomy. Carotid stenosis measurements (when applicable) are obtained utilizing NASCET criteria, using the distal internal carotid diameter as the denominator. RADIATION DOSE REDUCTION: This exam was performed according to the departmental dose-optimization program which includes automated exposure control, adjustment of the mA and/or kV according to patient size and/or use of iterative reconstruction technique. CONTRAST:  75mL OMNIPAQUE IOHEXOL 350 MG/ML SOLN COMPARISON:  Same day brain MRI FINDINGS: CTA NECK FINDINGS Skeleton: No  acute abnormality or high grade bony spinal canal stenosis. Other neck: Normal pharynx, larynx and major salivary glands. No cervical lymphadenopathy. Unremarkable thyroid gland. Upper chest: No pneumothorax or pleural effusion. No nodules or masses. Aortic arch: There is calcific atherosclerosis of the aortic arch. Normal variant aortic arch branching pattern with the brachiocephalic and left common carotid arteries sharing a common origin. RIGHT carotid system: No dissection, occlusion or aneurysm. Mild atherosclerotic calcification at the carotid bifurcation without hemodynamically significant stenosis. LEFT carotid system: No dissection, occlusion or aneurysm. There is mixed density atherosclerosis extending into the proximal ICA, resulting in less than 50% stenosis. Vertebral arteries: Right dominant configuration. There is no dissection, occlusion or flow-limiting stenosis to the skull base (V1-V3 segments). CTA HEAD FINDINGS POSTERIOR CIRCULATION: Vertebral arteries are normal. 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 Basilar artery is normal. Superior cerebellar arteries are normal. Posterior cerebral arteries are normal. ANTERIOR CIRCULATION: Right-greater-than-left atherosclerotic calcification with moderate stenosis of the right clinoid segment. Anterior cerebral arteries are normal. Middle cerebral arteries are normal. Venous sinuses: As permitted by contrast timing, patent. Anatomic variants: None Review of the MIP images confirms the above findings. 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 3. Bilateral carotid bifurcation atherosclerosis without hemodynamically significant stenosis by NASCET criteria. Aortic Atherosclerosis (ICD10-I70.0). Electronically Signed   By: Deatra Robinson M.D.   On: 02/17/2023 02:50   MR BRAIN WO CONTRAST Result Date: 02/17/2023 CLINICAL DATA:  Nausea and vomiting EXAM: MRI HEAD WITHOUT CONTRAST TECHNIQUE: Multiplanar, multiecho pulse  sequences of the brain and surrounding structures were obtained without intravenous contrast. COMPARISON:  Head CT 02/16/2023 FINDINGS: Brain: There is a large acute infarct of the inferior right cerebellar hemisphere. Small amount of acute ischemia within the medial left cerebellum. No supratentorial diffusion abnormality. Numerous chronic microhemorrhages, predominantly central. There is mass effect within the posterior fossa with narrowing of the fourth ventricle inferiorly. No hydrocephalus. There is confluent hyperintense T2-weighted signal within the white matter. Generalized volume loss. The midline structures are normal. Vascular: Normal flow voids. Skull and upper cervical spine: Normal calvarium and skull base. Visualized upper cervical spine and soft tissues are normal. Sinuses/Orbits:No paranasal sinus fluid levels or advanced mucosal thickening. No mastoid or middle ear effusion. Normal orbits. IMPRESSION: 1. Large acute infarct of the inferior right cerebellar hemisphere with mass effect within the posterior fossa and narrowing of the fourth ventricle inferiorly. No hydrocephalus. 2. Small amount of acute ischemia within the medial left cerebellum. 3. Numerous chronic microhemorrhages, predominantly central, likely chronic hypertensive angiopathy Electronically  Signed   By: Deatra Robinson M.D.   On: 02/17/2023 00:59   CT Head Wo Contrast Result Date: 02/16/2023 CLINICAL DATA:  Headache EXAM: CT HEAD WITHOUT CONTRAST TECHNIQUE: Contiguous axial images were obtained from the base of the skull through the vertex without intravenous contrast. RADIATION DOSE REDUCTION: This exam was performed according to the departmental dose-optimization program which includes automated exposure control, adjustment of the mA and/or kV according to patient size and/or use of iterative reconstruction technique. COMPARISON:  None Available. FINDINGS: Brain: There is an acute/early subacute infarct of the inferior medial  right cerebellum. Chronic ischemic white matter changes. Old right caudate head small vessel infarct. Vascular: Atherosclerotic calcification of the internal carotid arteries at the skull base. No abnormal hyperdensity of the major intracranial arteries or dural venous sinuses. Skull: The visualized skull base, calvarium and extracranial soft tissues are normal. Sinuses/Orbits: No fluid levels or advanced mucosal thickening of the visualized paranasal sinuses. No mastoid or middle ear effusion. Normal orbits. Other: None. 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 No hemorrhage or mass effect. Electronically Signed   By: Deatra Robinson M.D.   On: 02/16/2023 23:07   DG Chest 2 View Result Date: 02/16/2023 CLINICAL DATA:  chest pain EXAM: CHEST - 2 VIEW COMPARISON:  02/26/2008. FINDINGS: The heart size and mediastinal contours are within normal limits. Both lungs are clear. No pneumothorax or pleural effusion. There are thoracic degenerative changes. IMPRESSION: No acute cardiopulmonary disease. Electronically Signed   By: Layla Maw M.D.   On: 02/16/2023 18:25     PHYSICAL EXAM  Temp:  [97.9 F (36.6 C)-98.7 F (37.1 C)] 98.7 F (37.1 C) (12/30 2000) Pulse Rate:  [44-90] 70 (12/30 2000) Resp:  [14-20] 17 (12/30 2000) BP: (132-189)/(75-127) 135/75 (12/30 2000) SpO2:  [98 %-100 %] 100 % (12/30 2000)  General - Well nourished, well developed, in no apparent distress.  Ophthalmologic - fundi not visualized due to noncooperation.  Cardiovascular - Regular rhythm and rate.  Mental Status -  Level of arousal and orientation to time, place, and person were intact. Language including expression, naming, repetition, comprehension was assessed and found intact. Fund of Knowledge was assessed and was intact.  Cranial Nerves II - XII - II - Visual field intact OU. III, IV, VI - Extraocular movements intact. V - Facial sensation intact bilaterally. VII -  Facial movement intact bilaterally. VIII - Hearing & vestibular intact bilaterally. Right gaze with unsustained horizontal nystagmus, direction to right X - Palate elevates symmetrically. XI - Chin turning & shoulder shrug intact bilaterally. XII - Tongue protrusion intact.  Motor Strength - The patient's strength was normal in all extremities and pronator drift was absent.  Bulk was normal and fasciculations were absent.   Motor Tone - Muscle tone was assessed at the neck and appendages and was normal.  Reflexes - The patient's reflexes were symmetrical in all extremities and she had no pathological reflexes.  Sensory - Light touch, temperature/pinprick were assessed and were symmetrical.    Coordination - The patient had normal movements in the hands and feet with no ataxia or dysmetria, mildly slow on the right FTN than left.  Tremor was absent.  Gait and Station - deferred.   ASSESSMENT/PLAN Ms. Levora Murrah is a 62 y.o. female with history of HTN, migraine, smoker admitted for imbalance, HA, nausea for 4 days. No TNK given due to outside window.    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 Pt has ASA allergy Patient counseled to be compliant with her antithrombotic medications Ongoing aggressive stroke risk factor management Therapy recommendations:  SNF Disposition:  pending  Hypertension Stable on the high end On amlodipine 5 Long term BP goal normotensive  Hyperlipidemia Home meds:  none  LDL 160, goal < 70 Now on lipitor 80 Continue statin at discharge  Tobacco  abuse Current smoker Smoking cessation counseling provided Pt is willing to quit  Cocaine abuse UDS positive for cocaine Cocaine cessation education provided Pt is willing to quit  Other Stroke Risk Factors Advanced age Obesity, Body mass index is 35.77 kg/m.  Migraine   Other Active Problems AKI, Cre 1.05->0.85 Mild leukocytosis WBC 11.7  Hospital day # 0  I spent  30 min additional inpatient time with the patient, more than 50% of which was spent in counseling and coordination of care, reviewing test results, images and medication, and discussing the diagnosis, treatment plan and potential prognosis. This patient's care requiresreview of multiple databases, neurological assessment, discussion with family, other specialists and medical decision making of high complexity.    Marvel Plan, MD PhD Stroke Neurology 02/17/2023 9:10 PM    To contact Stroke Continuity provider, please refer to [email or web address]. After hours, contact General Neurology

## 2023-02-17 NOTE — ED Notes (Signed)
ED TO INPATIENT HANDOFF REPORT  Name/Age/Gender Lindsey Mercer [birth year] 62 y.o. female  Code Status    Code Status Orders  (From admission, onward)           Start     Ordered   02/17/23 0631  Full code  Continuous       Question:  By:  Answer:  Consent: discussion documented in EHR   02/17/23 0630           Code Status History     This patient has a current code status but no historical code status.       Home/SNF/Other Home  Chief Complaint Acute CVA (cerebrovascular accident) (HCC) [I63.9]  Level of Care/Admitting Diagnosis ED Disposition     ED Disposition  Admit   Condition  --   Comment  Hospital Area: [facility] [[id number]]  Level of Care: Progressive [102]  Admit to Progressive based on following criteria: NEUROLOGICAL AND NEUROSURGICAL complex patients with significant risk of instability, who do not meet ICU criteria, yet require close observation or frequent assessment (< / = every 2 - 4 hours) with medical / nursing intervention.  May admit patient to [facility] or [facility] if equivalent level of care is available:: Yes  Covid Evaluation: Asymptomatic - no recent exposure (last 10 days) testing not required  Diagnosis: Acute CVA (cerebrovascular accident) [facility] Lindsey Mercer) [[id number]]  Admitting Physician: Lindsey Mercer [[id number]]  Attending Physician: Lindsey Mercer [[id number]]  Certification:: I certify this patient will need inpatient services for at least 2 midnights          Medical History Past Medical History:  Diagnosis Date   Allergy    Arthritis    DJD (degenerative joint disease)    Hypertension     Allergies Allergies  Allergen Reactions   Aspirin Hives and Nausea Only   Lisinopril     REACTION: Dry cough   Penicillins     REACTION: hives   Sulfa Antibiotics Hives    IV Location/Drains/Wounds Patient Lines/Drains/Airways Status     Active Line/Drains/Airways     Name Placement date Placement time Site Days    Peripheral IV 02/16/23 20 G Right Antecubital 02/16/23  2307  Antecubital  1            Labs/Imaging Results for orders placed or performed during the hospital encounter of 02/16/23 (from the past 48 hours)  CBC     Status: Abnormal   Collection Time: 02/16/23  5:17 PM  Result Value Ref Range   WBC 11.1 (H) 4.0 - 10.5 K/uL   RBC 6.10 (H) 3.87 - 5.11 MIL/uL   Hemoglobin 15.6 (H) 12.0 - 15.0 g/dL   HCT 95.6 (H) 21.3 - 08.6 %   MCV 79.5 (L) 80.0 - 100.0 fL   MCH 25.6 (L) 26.0 - 34.0 pg   MCHC 32.2 30.0 - 36.0 g/dL   RDW 57.8 (H) 46.9 - 62.9 %   Platelets 372 150 - 400 K/uL   nRBC 0.0 0.0 - 0.2 %    Comment: Performed at [facility], [address], [place], [place] [address]  Basic metabolic panel     Status: Abnormal   Collection Time: 02/16/23  5:17 PM  Result Value Ref Range   Sodium 139 135 - 145 mmol/L   Potassium 3.9 3.5 - 5.1 mmol/L   Chloride 106 98 - 111 mmol/L   CO2 19 (L) 22 - 32 mmol/L   Glucose, Bld 115 (H)  70 - 99 mg/dL    Comment: Glucose reference range applies only to samples taken after fasting for at least 8 hours.   BUN 21 8 - 23 mg/dL   Creatinine, Ser 2.59 0.44 - 1.00 mg/dL   Calcium 9.2 8.9 - 56.3 mg/dL   GFR, Estimated >87 >56 mL/min    Comment: (NOTE) Calculated using the CKD-EPI Creatinine Equation (2021)    Anion gap 14 5 - 15    Comment: Performed at [facility], [address] [place], [place] [address]  Troponin I (High Sensitivity)     Status: None   Collection Time: 02/16/23  5:31 PM  Result Value Ref Range   Troponin I (High Sensitivity) 9 <18 ng/L    Comment: (NOTE) Elevated high sensitivity troponin I (hsTnI) values and significant  changes across serial measurements may suggest ACS but many other  chronic and acute conditions are known to elevate hsTnI results.  Refer to the "Links" section for chest pain algorithms and additional  guidance. Performed at [facility], [address] [place], [place] [address]   Troponin I (High Sensitivity)     Status: None   Collection Time: 02/16/23 11:15 PM  Result Value Ref Range   Troponin I (High Sensitivity) 9 <18 ng/L    Comment: (NOTE) Elevated high sensitivity troponin I (hsTnI) values and significant  changes across serial measurements may suggest ACS but many other  chronic and acute conditions are known to elevate hsTnI results.  Refer to the "Links" section for chest pain algorithms and additional  guidance. Performed at [facility], [address], [place], [place] [address]   Ethanol     Status: None   Collection Time: 02/16/23 11:15 PM  Result Value Ref Range   Alcohol, Ethyl (B) <10 <10 mg/dL    Comment: (NOTE) Lowest detectable limit for serum alcohol is 10 mg/dL.  For medical purposes only. Performed at [facility], [address], [place], [place] [address]   Protime-INR     Status: None   Collection Time: 02/16/23 11:15 PM  Result Value Ref Range   Prothrombin Time 13.8 11.4 - 15.2 seconds   INR 1.0 0.8 - 1.2    Comment: (NOTE) INR goal varies based on device and disease states. Performed at [facility], [address], [place], [place] [address]   APTT     Status: None   Collection Time: 02/16/23 11:15 PM  Result Value Ref Range   aPTT 31 24 - 36 seconds    Comment: Performed at [facility], [address], [place], [place] [address]  Differential     Status: Abnormal   Collection Time: 02/16/23 11:15 PM  Result Value Ref Range   Neutrophils Relative % 77 %   Neutro Abs 9.8 (H) 1.7 - 7.7 K/uL   Lymphocytes Relative 15 %   Lymphs Abs 2.0 0.7 - 4.0 K/uL   Monocytes Relative 8 %   Monocytes Absolute 1.0 0.1 - 1.0 K/uL   Eosinophils Relative 0 %   Eosinophils Absolute 0.0 0.0 - 0.5 K/uL   Basophils Relative 0 %   Basophils Absolute 0.0 0.0 - 0.1 K/uL   Immature Granulocytes 0 %   Abs Immature  Granulocytes 0.03 0.00 - 0.07 K/uL    Comment: Performed at [facility], [address], [place], [place] [address]  Comprehensive metabolic panel     Status: Abnormal  Collection Time: 02/16/23 11:15 PM  Result Value Ref Range   Sodium 136 135 - 145 mmol/L   Potassium 4.0 3.5 - 5.1 mmol/L   Chloride 102 98 - 111 mmol/L   CO2 23 22 - 32 mmol/L   Glucose, Bld 98 70 - 99 mg/dL    Comment: Glucose reference range applies only to samples taken after fasting for at least 8 hours.   BUN 24 (H) 8 - 23 mg/dL   Creatinine, Ser 1.61 (H) 0.44 - 1.00 mg/dL   Calcium 9.2 8.9 - 09.6 mg/dL   Total Protein 8.8 (H) 6.5 - 8.1 g/dL   Albumin 4.1 3.5 - 5.0 g/dL   AST 33 15 - 41 U/L   ALT 46 (H) 0 - 44 U/L   Alkaline Phosphatase 78 38 - 126 U/L   Total Bilirubin 1.0 <1.2 mg/dL   GFR, Estimated >04 >54 mL/min    Comment: (NOTE) Calculated using the CKD-EPI Creatinine Equation (2021)    Anion gap 11 5 - 15    Comment: Performed at [facility], [address], [place], [place] [address]  Urine rapid drug screen (hosp performed)     Status: Abnormal   Collection Time: 02/16/23 11:41 PM  Result Value Ref Range   Opiates NONE DETECTED NONE DETECTED   Cocaine POSITIVE (A) NONE DETECTED   Benzodiazepines NONE DETECTED NONE DETECTED   Amphetamines NONE DETECTED NONE DETECTED   Tetrahydrocannabinol NONE DETECTED NONE DETECTED   Barbiturates NONE DETECTED NONE DETECTED    Comment: (NOTE) DRUG SCREEN FOR MEDICAL PURPOSES ONLY.  IF CONFIRMATION IS NEEDED FOR ANY PURPOSE, NOTIFY LAB WITHIN 5 DAYS.  LOWEST DETECTABLE LIMITS FOR URINE DRUG SCREEN Drug Class                     Cutoff (ng/mL) Amphetamine and metabolites    1000 Barbiturate and metabolites    200 Benzodiazepine                 200 Opiates and metabolites        300 Cocaine and metabolites        300 THC                            50 Performed at [facility], [address], [place], [place] [address]   Urinalysis, Routine w reflex microscopic -Urine, Clean Catch     Status: Abnormal   Collection Time: 02/16/23 11:41 PM  Result Value Ref Range   Color, Urine AMBER (A) YELLOW    Comment: BIOCHEMICALS MAY BE AFFECTED BY COLOR   APPearance CLOUDY (A) CLEAR   Specific Gravity, Urine 1.020 1.005 - 1.030   pH 5.0 5.0 - 8.0   Glucose, UA NEGATIVE NEGATIVE mg/dL   Hgb urine dipstick SMALL (A) NEGATIVE   Bilirubin Urine NEGATIVE NEGATIVE   Ketones, ur NEGATIVE NEGATIVE mg/dL   Protein, ur 30 (A) NEGATIVE mg/dL   Nitrite POSITIVE (A) NEGATIVE   Leukocytes,Ua TRACE (A) NEGATIVE   RBC / HPF 0-5 0 - 5 RBC/hpf   WBC, UA 11-20 0 - 5 WBC/hpf   Bacteria, UA MANY (A) NONE SEEN   Squamous Epithelial / HPF 6-10 0 - 5 /HPF   Mucus PRESENT    Hyaline Casts, UA PRESENT     Comment: Performed at [facility], [address], [place], [place] [address]  Lipid panel  Status: Abnormal   Collection Time: 02/17/23  5:15 AM  Result Value Ref Range   Cholesterol 235 (H) 0 - 200 mg/dL   Triglycerides 161 <096 mg/dL   HDL 50 >04 mg/dL   Total CHOL/HDL Ratio 4.7 RATIO   VLDL 25 0 - 40 mg/dL   LDL Cholesterol 540 (H) 0 - 99 mg/dL    Comment:        Total Cholesterol/HDL:CHD Risk Coronary Heart Disease Risk Table                     Men   Women  1/2 Average Risk   3.4   3.3  Average Risk       5.0   4.4  2 X Average Risk   9.6   7.1  3 X Average Risk  23.4   11.0        Use the calculated Patient Ratio above and the CHD Risk Table to determine the patient's CHD Risk.        ATP III CLASSIFICATION (LDL):  <100     mg/dL   Optimal  981-191  mg/dL   Near or Above                    Optimal  130-159  mg/dL   Borderline  478-295  mg/dL   High  >621     mg/dL   Very High Performed at [facility], [address], [place], [place] [address]   HIV Antibody (routine testing w rflx)     Status: None   Collection Time: 02/17/23  6:30 AM   Result Value Ref Range   HIV Screen 4th Generation wRfx Non Reactive Non Reactive    Comment: Performed at [facility], [address], [place], [place] [address]  CBC     Status: Abnormal   Collection Time: 02/17/23  6:30 AM  Result Value Ref Range   WBC 11.7 (H) 4.0 - 10.5 K/uL   RBC 5.48 (H) 3.87 - 5.11 MIL/uL   Hemoglobin 14.3 12.0 - 15.0 g/dL   HCT 62.9 52.8 - 41.3 %   MCV 79.6 (L) 80.0 - 100.0 fL   MCH 26.1 26.0 - 34.0 pg   MCHC 32.8 30.0 - 36.0 g/dL   RDW 24.4 (H) 01.0 - 27.2 %   Platelets 329 150 - 400 K/uL   nRBC 0.0 0.0 - 0.2 %    Comment: Performed at [facility], [address], [place], [place] [address]  Creatinine, serum     Status: None   Collection Time: 02/17/23  6:30 AM  Result Value Ref Range   Creatinine, Ser 0.85 0.44 - 1.00 mg/dL   GFR, Estimated >40 >34 mL/min    Comment: (NOTE) Calculated using the CKD-EPI Creatinine Equation (2021) Performed at [facility], [address], [place], [place] [address]    ECHOCARDIOGRAM COMPLETE Result Date: 02/17/2023    ECHOCARDIOGRAM REPORT   Patient Name:   BREINDEL HUCK Date of Exam: 02/17/2023 Medical Rec #:  [id number]      Height:       65.0 in Accession #:    [id number]     Weight:       214.9 lb Date of Birth:  [birth year]      BSA:          2.040 m Patient Age:    62 years       BP:  148/98 mmHg Patient Gender: F              HR:           73 bpm. Exam Location:  Inpatient Procedure: 2D Echo, Cardiac Doppler and Color Doppler Indications:    Stroke  History:        Patient has no prior history of Echocardiogram examinations.                 Risk Factors:Hypertension and Former Smoker.  Sonographer:    Karma Ganja Referring Phys: [id number] ASHISH ARORA IMPRESSIONS  1. Left ventricular ejection fraction, by estimation, is 60 to 65%. The left ventricle has normal function. The left ventricle has no regional wall motion abnormalities. There is severe concentric left  ventricular hypertrophy. Left ventricular diastolic  parameters are consistent with Grade I diastolic dysfunction (impaired relaxation). Elevated left ventricular end-diastolic pressure.  2. Right ventricular systolic function is normal. The right ventricular size is normal. There is normal pulmonary artery systolic pressure.  3. The mitral valve is normal in structure. No evidence of mitral valve regurgitation. No evidence of mitral stenosis.  4. The aortic valve is tricuspid. There is mild calcification of the aortic valve. Aortic valve regurgitation is not visualized. No aortic stenosis is present.  5. The inferior vena cava is normal in size with greater than 50% respiratory variability, suggesting right atrial pressure of 3 mmHg. FINDINGS  Left Ventricle: Left ventricular ejection fraction, by estimation, is 60 to 65%. The left ventricle has normal function. The left ventricle has no regional wall motion abnormalities. The left ventricular internal cavity size was normal in size. There is  severe concentric left ventricular hypertrophy. Left ventricular diastolic parameters are consistent with Grade I diastolic dysfunction (impaired relaxation). Elevated left ventricular end-diastolic pressure. Right Ventricle: The right ventricular size is normal. No increase in right ventricular wall thickness. Right ventricular systolic function is normal. There is normal pulmonary artery systolic pressure. The tricuspid regurgitant velocity is 1.89 m/s, and  with an assumed right atrial pressure of 3 mmHg, the estimated right ventricular systolic pressure is 17.3 mmHg. Left Atrium: Left atrial size was normal in size. Right Atrium: Right atrial size was normal in size. Pericardium: There is no evidence of pericardial effusion. Mitral Valve: The mitral valve is normal in structure. No evidence of mitral valve regurgitation. No evidence of mitral valve stenosis. Tricuspid Valve: The tricuspid valve is normal in structure.  Tricuspid valve regurgitation is trivial. No evidence of tricuspid stenosis. Aortic Valve: The aortic valve is tricuspid. There is mild calcification of the aortic valve. Aortic valve regurgitation is not visualized. No aortic stenosis is present. Aortic valve mean gradient measures 4.0 mmHg. Aortic valve peak gradient measures 8.9 mmHg. Aortic valve area, by VTI measures 2.10 cm. Pulmonic Valve: The pulmonic valve was normal in structure. Pulmonic valve regurgitation is not visualized. No evidence of pulmonic stenosis. Aorta: The aortic root is normal in size and structure. Venous: The inferior vena cava is normal in size with greater than 50% respiratory variability, suggesting right atrial pressure of 3 mmHg. IAS/Shunts: No atrial level shunt detected by color flow Doppler.  LEFT VENTRICLE PLAX 2D LVIDd:         3.90 cm   Diastology LVIDs:         2.50 cm   LV e' medial:    3.26 cm/s LV PW:         1.80 cm   LV E/e' medial:  15.0 LV  IVS:        1.55 cm   LV e' lateral:   4.03 cm/s LVOT diam:     2.00 cm   LV E/e' lateral: 12.1 LV SV:         39 LV SV Index:   19 LVOT Area:     3.14 cm  RIGHT VENTRICLE             IVC RV Basal diam:  3.80 cm     IVC diam: 1.30 cm RV S prime:     17.10 cm/s TAPSE (M-mode): 1.9 cm LEFT ATRIUM             Index        RIGHT ATRIUM           Index LA diam:        4.80 cm 2.35 cm/m   RA Area:     19.10 cm LA Vol (A2C):   20.8 ml 10.20 ml/m  RA Volume:   50.20 ml  24.61 ml/m LA Vol (A4C):   43.2 ml 21.18 ml/m LA Biplane Vol: 31.9 ml 15.64 ml/m  AORTIC VALVE AV Area (Vmax):    2.45 cm AV Area (Vmean):   2.00 cm AV Area (VTI):     2.10 cm AV Vmax:           149.00 cm/s AV Vmean:          88.200 cm/s AV VTI:            0.184 m AV Peak Grad:      8.9 mmHg AV Mean Grad:      4.0 mmHg LVOT Vmax:         116.00 cm/s LVOT Vmean:        56.200 cm/s LVOT VTI:          0.123 m LVOT/AV VTI ratio: 0.67  AORTA Ao Root diam: 3.00 cm Ao Asc diam:  3.40 cm MITRAL VALVE               TRICUSPID  VALVE MV Area (PHT): 2.99 cm    TR Peak grad:   14.3 mmHg MV Decel Time: 254 msec    TR Vmax:        189.00 cm/s MV E velocity: 48.80 cm/s MV A velocity: 61.70 cm/s  SHUNTS MV E/A ratio:  0.79        Systemic VTI:  0.12 m                            Systemic Diam: 2.00 cm Lindsey Si MD Electronically signed by Lindsey Si MD Signature Date/Time: 02/17/2023/4:48:36 PM    Final    CT HEAD WO CONTRAST ( ) Result Date: 02/17/2023 CLINICAL DATA:  Stroke, follow-up. EXAM: CT HEAD WITHOUT CONTRAST TECHNIQUE: Contiguous axial images were obtained from the base of the skull through the vertex without intravenous contrast. RADIATION DOSE REDUCTION: This exam was performed according to the departmental dose-optimization program which includes automated exposure control, adjustment of the mA and/or kV according to patient size and/or use of iterative reconstruction technique. COMPARISON:  MRI brain 02/17/2023.  Head CT 02/16/2023. FINDINGS: Brain: Evolving large acute infarct in the medial aspect of the right cerebellar hemisphere. No acute hemorrhage. Slightly increased mass effect on the inferior fourth ventricle. No acute hydrocephalus. 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 No extra-axial collection or midline shift.  Vascular: No hyperdense vessel or unexpected calcification. Skull: No calvarial fracture or suspicious bone lesion. Skull base is unremarkable. Sinuses/Orbits: No acute findings. Other: None. IMPRESSION: Evolving large acute infarct in the medial aspect of the right cerebellar hemisphere. No acute hemorrhage. Slightly increased mass effect on the inferior fourth ventricle. No acute hydrocephalus. Electronically Signed   By: Lindsey Mercer M.D.   On: 02/17/2023 09:58   CT ANGIO HEAD NECK W WO CM Result Date: 02/17/2023 CLINICAL DATA:  Acute neurologic deficit EXAM: CT ANGIOGRAPHY HEAD AND NECK WITH AND WITHOUT  CONTRAST TECHNIQUE: Multidetector CT imaging of the head and neck was performed using the standard protocol during bolus administration of intravenous contrast. Multiplanar CT image reconstructions and MIPs were obtained to evaluate the vascular anatomy. Carotid stenosis measurements (when applicable) are obtained utilizing NASCET criteria, using the distal internal carotid diameter as the denominator. RADIATION DOSE REDUCTION: This exam was performed according to the departmental dose-optimization program which includes automated exposure control, adjustment of the mA and/or kV according to patient size and/or use of iterative reconstruction technique. CONTRAST:  75mL OMNIPAQUE IOHEXOL 350 MG/ML SOLN COMPARISON:  Same day brain MRI FINDINGS: CTA NECK FINDINGS Skeleton: No acute abnormality or high grade bony spinal canal stenosis. Other neck: Normal pharynx, larynx and major salivary glands. No cervical lymphadenopathy. Unremarkable thyroid gland. Upper chest: No pneumothorax or pleural effusion. No nodules or masses. Aortic arch: There is calcific atherosclerosis of the aortic arch. Normal variant aortic arch branching pattern with the brachiocephalic and left common carotid arteries sharing a common origin. RIGHT carotid system: No dissection, occlusion or aneurysm. Mild atherosclerotic calcification at the carotid bifurcation without hemodynamically significant stenosis. LEFT carotid system: No dissection, occlusion or aneurysm. There is mixed density atherosclerosis extending into the proximal ICA, resulting in less than 50% stenosis. Vertebral arteries: Right dominant configuration. There is no dissection, occlusion or flow-limiting stenosis to the skull base (V1-V3 segments). CTA HEAD FINDINGS POSTERIOR CIRCULATION: Vertebral arteries are normal. 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 Basilar artery is normal. Superior cerebellar arteries are normal. Posterior cerebral arteries are normal. ANTERIOR CIRCULATION:  Right-greater-than-left atherosclerotic calcification with moderate stenosis of the right clinoid segment. Anterior cerebral arteries are normal. Middle cerebral arteries are normal. Venous sinuses: As permitted by contrast timing, patent. Anatomic variants: None Review of the MIP images confirms the above findings. 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 3. Bilateral carotid bifurcation atherosclerosis without hemodynamically significant stenosis by NASCET criteria. Aortic Atherosclerosis (ICD10-I70.0). Electronically Signed   By: Lindsey Mercer M.D.   On: 02/17/2023 02:50   MR BRAIN WO CONTRAST Result Date: 02/17/2023 CLINICAL DATA:  Nausea and vomiting EXAM: MRI HEAD WITHOUT CONTRAST TECHNIQUE: Multiplanar, multiecho pulse sequences of the brain and surrounding structures were obtained without intravenous contrast. COMPARISON:  Head CT 02/16/2023 FINDINGS: Brain: There is a large acute infarct of the inferior right cerebellar hemisphere. Small amount of acute ischemia within the medial left cerebellum. No supratentorial diffusion abnormality. Numerous chronic microhemorrhages, predominantly central. There is mass effect within the posterior fossa with narrowing of the fourth ventricle inferiorly. No hydrocephalus. There is confluent hyperintense T2-weighted signal within the white matter. Generalized volume loss. The midline structures are normal. Vascular: Normal flow voids. Skull and upper cervical spine: Normal calvarium and skull base. Visualized upper cervical spine and soft tissues are normal. Sinuses/Orbits:No paranasal sinus fluid levels or advanced mucosal thickening. No mastoid or middle ear effusion. Normal orbits. IMPRESSION: 1. Large acute infarct of  the inferior right cerebellar hemisphere with mass effect within the posterior fossa and narrowing of the fourth ventricle inferiorly. No hydrocephalus. 2. Small amount of acute ischemia within the  medial left cerebellum. 3. Numerous chronic microhemorrhages, predominantly central, likely chronic hypertensive angiopathy Electronically Signed   By: Lindsey Mercer M.D.   On: 02/17/2023 00:59   CT Head Wo Contrast Result Date: 02/16/2023 CLINICAL DATA:  Headache EXAM: CT HEAD WITHOUT CONTRAST TECHNIQUE: Contiguous axial images were obtained from the base of the skull through the vertex without intravenous contrast. RADIATION DOSE REDUCTION: This exam was performed according to the departmental dose-optimization program which includes automated exposure control, adjustment of the mA and/or kV according to patient size and/or use of iterative reconstruction technique. COMPARISON:  None Available. FINDINGS: Brain: There is an acute/early subacute infarct of the inferior medial right cerebellum. Chronic ischemic white matter changes. Old right caudate head small vessel infarct. Vascular: Atherosclerotic calcification of the internal carotid arteries at the skull base. No abnormal hyperdensity of the major intracranial arteries or dural venous sinuses. Skull: The visualized skull base, calvarium and extracranial soft tissues are normal. Sinuses/Orbits: No fluid levels or advanced mucosal thickening of the visualized paranasal sinuses. No mastoid or middle ear effusion. Normal orbits. Other: None. 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 No hemorrhage or mass effect. Electronically Signed   By: Lindsey Mercer M.D.   On: 02/16/2023 23:07   DG Chest 2 View Result Date: 02/16/2023 CLINICAL DATA:  chest pain EXAM: CHEST - 2 VIEW COMPARISON:  02/26/2008. FINDINGS: The heart size and mediastinal contours are within normal limits. Both lungs are clear. No pneumothorax or pleural effusion. There are thoracic degenerative changes. IMPRESSION: No acute cardiopulmonary disease. Electronically Signed   By: Lindsey Mercer M.D.   On: 02/16/2023 18:25    Pending Labs Unresulted  Labs (From admission, onward)     Start     Ordered   02/24/23 0500  Creatinine, serum  (enoxaparin (LOVENOX)    CrCl >/= 30 ml/min)  Weekly,   R     Comments: while on enoxaparin therapy    02/17/23 0630   02/17/23 0124  Hemoglobin A1c  (Labs)  Once,   URGENT       Comments: To assess prior glycemic control    02/17/23 0124            Vitals/Pain Today's Vitals   02/17/23 1223 02/17/23 1330 02/17/23 1342 02/17/23 1630  BP:  (!) 144/89 (!) 144/89 (!) 152/89  Pulse:  (!) 44 (!) 52 71  Resp:   18 17  Temp: 97.9 F (36.6 C)   97.9 F (36.6 C)  TempSrc: Oral     SpO2:  100% 100% 100%  Weight:      Height:      PainSc:        Isolation Precautions No active isolations  Medications Medications  labetalol (NORMODYNE) injection 10 mg (has no administration in time range)  LORazepam (ATIVAN) injection 1 mg (1 mg Intravenous Given 02/16/23 2352)   stroke: early stages of recovery book (has no administration in time range)  clopidogrel (PLAVIX) tablet 75 mg (has no administration in time range)  enoxaparin (LOVENOX) injection 40 mg (40 mg Subcutaneous Given 02/17/23 1035)  prochlorperazine (COMPAZINE) injection 5 mg (has no administration in time range)  amLODipine (NORVASC) tablet 5 mg (5 mg Oral Given 02/17/23 0759)  atorvastatin (LIPITOR) tablet 80 mg (80 mg Oral Given 02/17/23 1035)  oxyCODONE-acetaminophen (PERCOCET/ROXICET)  5-325 MG per tablet 1 tablet (1 tablet Oral Given 02/17/23 1035)  hydrALAZINE (APRESOLINE) injection 10 mg (10 mg Intravenous Given 02/16/23 2310)  iohexol (OMNIPAQUE) 350 MG/ML injection 75 mL (75 mLs Intravenous Contrast Given 02/17/23 0213)    Mobility non-ambulatory

## 2023-02-17 NOTE — ED Notes (Signed)
ED TO INPATIENT HANDOFF REPORT  Name/Age/Gender Lindsey Mercer 62 y.o. female  Code Status    Code Status Orders  (From admission, onward)           Start     Ordered   02/17/23 0631  Full code  Continuous       Question:  By:  Answer:  Consent: discussion documented in EHR   02/17/23 0630           Code Status History     This patient has a current code status but no historical code status.       Home/SNF/Other Home  Chief Complaint Acute CVA (cerebrovascular accident) (HCC) [I63.9]  Level of Care/Admitting Diagnosis ED Disposition     ED Disposition  Admit   Condition  --   Comment  Hospital Area: [facility] [[id number]]  Level of Care: Progressive [102]  Admit to Progressive based on following criteria: NEUROLOGICAL AND NEUROSURGICAL complex patients with significant risk of instability, who do not meet ICU criteria, yet require close observation or frequent assessment (< / = every 2 - 4 hours) with medical / nursing intervention.  May admit patient to [facility] or [facility] if equivalent level of care is available:: Yes  Covid Evaluation: Asymptomatic - no recent exposure (last 10 days) testing not required  Diagnosis: Acute CVA (cerebrovascular accident) [facility]) [[id number]]  Admitting Physician: Darlin Drop [[id number]]  Attending Physician: Darlin Drop [[id number]]  Certification:: I certify this patient will need inpatient services for at least 2 midnights          Medical History Past Medical History:  Diagnosis Date   Allergy    Arthritis    DJD (degenerative joint disease)    Hypertension     Allergies Allergies  Allergen Reactions   Aspirin Hives and Nausea Only   Lisinopril     REACTION: Dry cough   Penicillins     REACTION: hives   Sulfa Antibiotics Hives    IV Location/Drains/Wounds Patient Lines/Drains/Airways Status     Active Line/Drains/Airways     Name Placement date Placement time Site Days    Peripheral IV 02/16/23 20 G Right Antecubital 02/16/23  2307  Antecubital  1            Labs/Imaging Results for orders placed or performed during the hospital encounter of 02/16/23 (from the past 48 hours)  CBC     Status: Abnormal   Collection Time: 02/16/23  5:17 PM  Result Value Ref Range   WBC 11.1 (H) 4.0 - 10.5 K/uL   RBC 6.10 (H) 3.87 - 5.11 MIL/uL   Hemoglobin 15.6 (H) 12.0 - 15.0 g/dL   HCT 57.8 (H) 46.9 - 62.9 %   MCV 79.5 (L) 80.0 - 100.0 fL   MCH 25.6 (L) 26.0 - 34.0 pg   MCHC 32.2 30.0 - 36.0 g/dL   RDW 52.8 (H) 41.3 - 24.4 %   Platelets 372 150 - 400 K/uL   nRBC 0.0 0.0 - 0.2 %    Comment: Performed at [facility], [address] [place], [place] [address]  Basic metabolic panel     Status: Abnormal   Collection Time: 02/16/23  5:17 PM  Result Value Ref Range   Sodium 139 135 - 145 mmol/L   Potassium 3.9 3.5 - 5.1 mmol/L   Chloride 106 98 - 111 mmol/L   CO2 19 (L) 22 - 32 mmol/L   Glucose, Bld 115 (H)  70 - 99 mg/dL    Comment: Glucose reference range applies only to samples taken after fasting for at least 8 hours.   BUN 21 8 - 23 mg/dL   Creatinine, Ser 6.57 0.44 - 1.00 mg/dL   Calcium 9.2 8.9 - 84.6 mg/dL   GFR, Estimated >96 >29 mL/min    Comment: (NOTE) Calculated using the CKD-EPI Creatinine Equation (2021)    Anion gap 14 5 - 15    Comment: Performed at [facility], [address] [place], [place] [address]  Troponin I (High Sensitivity)     Status: None   Collection Time: 02/16/23  5:31 PM  Result Value Ref Range   Troponin I (High Sensitivity) 9 <18 ng/L    Comment: (NOTE) Elevated high sensitivity troponin I (hsTnI) values and significant  changes across serial measurements may suggest ACS but many other  chronic and acute conditions are known to elevate hsTnI results.  Refer to the "Links" section for chest pain algorithms and additional  guidance. Performed at [facility], [address], [place], [place] [address]   Troponin I (High Sensitivity)     Status: None   Collection Time: 02/16/23 11:15 PM  Result Value Ref Range   Troponin I (High Sensitivity) 9 <18 ng/L    Comment: (NOTE) Elevated high sensitivity troponin I (hsTnI) values and significant  changes across serial measurements may suggest ACS but many other  chronic and acute conditions are known to elevate hsTnI results.  Refer to the "Links" section for chest pain algorithms and additional  guidance. Performed at [facility], [address] [place], [place] [address]   Ethanol     Status: None   Collection Time: 02/16/23 11:15 PM  Result Value Ref Range   Alcohol, Ethyl (B) <10 <10 mg/dL    Comment: (NOTE) Lowest detectable limit for serum alcohol is 10 mg/dL.  For medical purposes only. Performed at [facility], [address], [place], [place] [address]   Protime-INR     Status: None   Collection Time: 02/16/23 11:15 PM  Result Value Ref Range   Prothrombin Time 13.8 11.4 - 15.2 seconds   INR 1.0 0.8 - 1.2    Comment: (NOTE) INR goal varies based on device and disease states. Performed at [facility], [address], [place], [place] [address]   APTT     Status: None   Collection Time: 02/16/23 11:15 PM  Result Value Ref Range   aPTT 31 24 - 36 seconds    Comment: Performed at [facility], [address], [place], [place] [address]  Differential     Status: Abnormal   Collection Time: 02/16/23 11:15 PM  Result Value Ref Range   Neutrophils Relative % 77 %   Neutro Abs 9.8 (H) 1.7 - 7.7 K/uL   Lymphocytes Relative 15 %   Lymphs Abs 2.0 0.7 - 4.0 K/uL   Monocytes Relative 8 %   Monocytes Absolute 1.0 0.1 - 1.0 K/uL   Eosinophils Relative 0 %   Eosinophils Absolute 0.0 0.0 - 0.5 K/uL   Basophils Relative 0 %   Basophils Absolute 0.0 0.0 - 0.1 K/uL   Immature Granulocytes 0 %   Abs Immature  Granulocytes 0.03 0.00 - 0.07 K/uL    Comment: Performed at [facility], [address] [place], [place] [address]  Comprehensive metabolic panel     Status: Abnormal  Collection Time: 02/16/23 11:15 PM  Result Value Ref Range   Sodium 136 135 - 145 mmol/L   Potassium 4.0 3.5 - 5.1 mmol/L   Chloride 102 98 - 111 mmol/L   CO2 23 22 - 32 mmol/L   Glucose, Bld 98 70 - 99 mg/dL    Comment: Glucose reference range applies only to samples taken after fasting for at least 8 hours.   BUN 24 (H) 8 - 23 mg/dL   Creatinine, Ser 0.10 (H) 0.44 - 1.00 mg/dL   Calcium 9.2 8.9 - 27.2 mg/dL   Total Protein 8.8 (H) 6.5 - 8.1 g/dL   Albumin 4.1 3.5 - 5.0 g/dL   AST 33 15 - 41 U/L   ALT 46 (H) 0 - 44 U/L   Alkaline Phosphatase 78 38 - 126 U/L   Total Bilirubin 1.0 <1.2 mg/dL   GFR, Estimated >53 >66 mL/min    Comment: (NOTE) Calculated using the CKD-EPI Creatinine Equation (2021)    Anion gap 11 5 - 15    Comment: Performed at [facility], [address] [place], [place] [address]  Urine rapid drug screen (hosp performed)     Status: Abnormal   Collection Time: 02/16/23 11:41 PM  Result Value Ref Range   Opiates NONE DETECTED NONE DETECTED   Cocaine POSITIVE (A) NONE DETECTED   Benzodiazepines NONE DETECTED NONE DETECTED   Amphetamines NONE DETECTED NONE DETECTED   Tetrahydrocannabinol NONE DETECTED NONE DETECTED   Barbiturates NONE DETECTED NONE DETECTED    Comment: (NOTE) DRUG SCREEN FOR MEDICAL PURPOSES ONLY.  IF CONFIRMATION IS NEEDED FOR ANY PURPOSE, NOTIFY LAB WITHIN 5 DAYS.  LOWEST DETECTABLE LIMITS FOR URINE DRUG SCREEN Drug Class                     Cutoff (ng/mL) Amphetamine and metabolites    1000 Barbiturate and metabolites    200 Benzodiazepine                 200 Opiates and metabolites        300 Cocaine and metabolites        300 THC                            50 Performed at [facility], [address], [place], [place] [address]   Urinalysis, Routine w reflex microscopic -Urine, Clean Catch     Status: Abnormal   Collection Time: 02/16/23 11:41 PM  Result Value Ref Range   Color, Urine AMBER (A) YELLOW    Comment: BIOCHEMICALS MAY BE AFFECTED BY COLOR   APPearance CLOUDY (A) CLEAR   Specific Gravity, Urine 1.020 1.005 - 1.030   pH 5.0 5.0 - 8.0   Glucose, UA NEGATIVE NEGATIVE mg/dL   Hgb urine dipstick SMALL (A) NEGATIVE   Bilirubin Urine NEGATIVE NEGATIVE   Ketones, ur NEGATIVE NEGATIVE mg/dL   Protein, ur 30 (A) NEGATIVE mg/dL   Nitrite POSITIVE (A) NEGATIVE   Leukocytes,Ua TRACE (A) NEGATIVE   RBC / HPF 0-5 0 - 5 RBC/hpf   WBC, UA 11-20 0 - 5 WBC/hpf   Bacteria, UA MANY (A) NONE SEEN   Squamous Epithelial / HPF 6-10 0 - 5 /HPF   Mucus PRESENT    Hyaline Casts, UA PRESENT     Comment: Performed at [facility], [address], [place], [place] [address]  Lipid panel  Status: Abnormal   Collection Time: 02/17/23  5:15 AM  Result Value Ref Range   Cholesterol 235 (H) 0 - 200 mg/dL   Triglycerides 161 <096 mg/dL   HDL 50 >04 mg/dL   Total CHOL/HDL Ratio 4.7 RATIO   VLDL 25 0 - 40 mg/dL   LDL Cholesterol 540 (H) 0 - 99 mg/dL    Comment:        Total Cholesterol/HDL:CHD Risk Coronary Heart Disease Risk Table                     Men   Women  1/2 Average Risk   3.4   3.3  Average Risk       5.0   4.4  2 X Average Risk   9.6   7.1  3 X Average Risk  23.4   11.0        Use the calculated Patient Ratio above and the CHD Risk Table to determine the patient's CHD Risk.        ATP III CLASSIFICATION (LDL):  <100     mg/dL   Optimal  981-191  mg/dL   Near or Above                    Optimal  130-159  mg/dL   Borderline  478-295  mg/dL   High  >621     mg/dL   Very High Performed at [facility], [address] [place], [place] [address]   HIV Antibody (routine testing w rflx)     Status: None   Collection Time: 02/17/23  6:30 AM   Result Value Ref Range   HIV Screen 4th Generation wRfx Non Reactive Non Reactive    Comment: Performed at [facility], [address], [place], [place] [address]  CBC     Status: Abnormal   Collection Time: 02/17/23  6:30 AM  Result Value Ref Range   WBC 11.7 (H) 4.0 - 10.5 K/uL   RBC 5.48 (H) 3.87 - 5.11 MIL/uL   Hemoglobin 14.3 12.0 - 15.0 g/dL   HCT 62.9 52.8 - 41.3 %   MCV 79.6 (L) 80.0 - 100.0 fL   MCH 26.1 26.0 - 34.0 pg   MCHC 32.8 30.0 - 36.0 g/dL   RDW 24.4 (H) 01.0 - 27.2 %   Platelets 329 150 - 400 K/uL   nRBC 0.0 0.0 - 0.2 %    Comment: Performed at [facility] [address], [place], [place] [address]  Creatinine, serum     Status: None   Collection Time: 02/17/23  6:30 AM  Result Value Ref Range   Creatinine, Ser 0.85 0.44 - 1.00 mg/dL   GFR, Estimated >40 >34 mL/min    Comment: (NOTE) Calculated using the CKD-EPI Creatinine Equation (2021) Performed at [facility], [address], [place], [place] [address]    CT HEAD WO CONTRAST ( ) Result Date: 02/17/2023 CLINICAL DATA:  Stroke, follow-up. EXAM: CT HEAD WITHOUT CONTRAST TECHNIQUE: Contiguous axial images were obtained from the base of the skull through the vertex without intravenous contrast. RADIATION DOSE REDUCTION: This exam was performed according to the departmental dose-optimization program which includes automated exposure control, adjustment of the mA and/or kV according to patient size and/or use of iterative reconstruction technique. COMPARISON:  MRI brain 02/17/2023.  Head CT 02/16/2023. FINDINGS: Brain: Evolving large acute infarct in the medial aspect of the right cerebellar hemisphere. No acute  hemorrhage. Slightly increased mass effect on the inferior fourth ventricle. No acute hydrocephalus. 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 No extra-axial collection or midline shift.  Vascular: No hyperdense vessel or unexpected calcification. Skull: No calvarial fracture or suspicious bone lesion. Skull base is unremarkable. Sinuses/Orbits: No acute findings. Other: None. IMPRESSION: Evolving large acute infarct in the medial aspect of the right cerebellar hemisphere. No acute hemorrhage. Slightly increased mass effect on the inferior fourth ventricle. No acute hydrocephalus. Electronically Signed   By: Orvan Falconer M.D.   On: 02/17/2023 09:58   CT ANGIO HEAD NECK W WO CM Result Date: 02/17/2023 CLINICAL DATA:  Acute neurologic deficit EXAM: CT ANGIOGRAPHY HEAD AND NECK WITH AND WITHOUT CONTRAST TECHNIQUE: Multidetector CT imaging of the head and neck was performed using the standard protocol during bolus administration of intravenous contrast. Multiplanar CT image reconstructions and MIPs were obtained to evaluate the vascular anatomy. Carotid stenosis measurements (when applicable) are obtained utilizing NASCET criteria, using the distal internal carotid diameter as the denominator. RADIATION DOSE REDUCTION: This exam was performed according to the departmental dose-optimization program which includes automated exposure control, adjustment of the mA and/or kV according to patient size and/or use of iterative reconstruction technique. CONTRAST:  75mL OMNIPAQUE IOHEXOL 350 MG/ML SOLN COMPARISON:  Same day brain MRI FINDINGS: CTA NECK FINDINGS Skeleton: No acute abnormality or high grade bony spinal canal stenosis. Other neck: Normal pharynx, larynx and major salivary glands. No cervical lymphadenopathy. Unremarkable thyroid gland. Upper chest: No pneumothorax or pleural effusion. No nodules or masses. Aortic arch: There is calcific atherosclerosis of the aortic arch. Normal variant aortic arch branching pattern with the brachiocephalic and left common carotid arteries sharing a common origin. RIGHT carotid system: No dissection, occlusion or aneurysm. Mild atherosclerotic calcification at  the carotid bifurcation without hemodynamically significant stenosis. LEFT carotid system: No dissection, occlusion or aneurysm. There is mixed density atherosclerosis extending into the proximal ICA, resulting in less than 50% stenosis. Vertebral arteries: Right dominant configuration. There is no dissection, occlusion or flow-limiting stenosis to the skull base (V1-V3 segments). CTA HEAD FINDINGS POSTERIOR CIRCULATION: Vertebral arteries are normal. 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 Basilar artery is normal. Superior cerebellar arteries are normal. Posterior cerebral arteries are normal. ANTERIOR CIRCULATION: Right-greater-than-left atherosclerotic calcification with moderate stenosis of the right clinoid segment. Anterior cerebral arteries are normal. Middle cerebral arteries are normal. Venous sinuses: As permitted by contrast timing, patent. Anatomic variants: None Review of the MIP images confirms the above findings. 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 3. Bilateral carotid bifurcation atherosclerosis without hemodynamically significant stenosis by NASCET criteria. Aortic Atherosclerosis (ICD10-I70.0). Electronically Signed   By: Deatra Robinson M.D.   On: 02/17/2023 02:50   MR BRAIN WO CONTRAST Result Date: 02/17/2023 CLINICAL DATA:  Nausea and vomiting EXAM: MRI HEAD WITHOUT CONTRAST TECHNIQUE: Multiplanar, multiecho pulse sequences of the brain and surrounding structures were obtained without intravenous contrast. COMPARISON:  Head CT 02/16/2023 FINDINGS: Brain: There is a large acute infarct of the inferior right cerebellar hemisphere. Small amount of acute ischemia within the medial left cerebellum. No supratentorial diffusion abnormality. Numerous chronic microhemorrhages, predominantly central. There is mass effect within the posterior fossa with narrowing of the fourth ventricle inferiorly. No hydrocephalus. There is confluent hyperintense T2-weighted  signal within the white matter. Generalized volume loss. The midline structures are normal. Vascular: Normal flow voids. Skull and upper cervical spine: Normal  calvarium and skull base. Visualized upper cervical spine and soft tissues are normal. Sinuses/Orbits:No paranasal sinus fluid levels or advanced mucosal thickening. No mastoid or middle ear effusion. Normal orbits. IMPRESSION: 1. Large acute infarct of the inferior right cerebellar hemisphere with mass effect within the posterior fossa and narrowing of the fourth ventricle inferiorly. No hydrocephalus. 2. Small amount of acute ischemia within the medial left cerebellum. 3. Numerous chronic microhemorrhages, predominantly central, likely chronic hypertensive angiopathy Electronically Signed   By: Deatra Robinson M.D.   On: 02/17/2023 00:59   CT Head Wo Contrast Result Date: 02/16/2023 CLINICAL DATA:  Headache EXAM: CT HEAD WITHOUT CONTRAST TECHNIQUE: Contiguous axial images were obtained from the base of the skull through the vertex without intravenous contrast. RADIATION DOSE REDUCTION: This exam was performed according to the departmental dose-optimization program which includes automated exposure control, adjustment of the mA and/or kV according to patient size and/or use of iterative reconstruction technique. COMPARISON:  None Available. FINDINGS: Brain: There is an acute/early subacute infarct of the inferior medial right cerebellum. Chronic ischemic white matter changes. Old right caudate head small vessel infarct. Vascular: Atherosclerotic calcification of the internal carotid arteries at the skull base. No abnormal hyperdensity of the major intracranial arteries or dural venous sinuses. Skull: The visualized skull base, calvarium and extracranial soft tissues are normal. Sinuses/Orbits: No fluid levels or advanced mucosal thickening of the visualized paranasal sinuses. No mastoid or middle ear effusion. Normal orbits. Other: None. IMPRESSION:  Acute/early subacute infarct of the inferior medial right cerebellum. No hemorrhage or mass effect. Electronically Signed   By: Deatra Robinson M.D.   On: 02/16/2023 23:07   DG Chest 2 View Result Date: 02/16/2023 CLINICAL DATA:  chest pain EXAM: CHEST - 2 VIEW COMPARISON:  02/26/2008. FINDINGS: The heart size and mediastinal contours are within normal limits. Both lungs are clear. No pneumothorax or pleural effusion. There are thoracic degenerative changes. IMPRESSION: No acute cardiopulmonary disease. Electronically Signed   By: Layla Maw M.D.   On: 02/16/2023 18:25    Pending Labs Unresulted Labs (From admission, onward)     Start     Ordered   02/24/23 0500  Creatinine, serum  (enoxaparin (LOVENOX)    CrCl >/= 30 ml/min)  Weekly,   R     Comments: while on enoxaparin therapy    02/17/23 0630   02/17/23 0124  Hemoglobin A1c  (Labs)  Once,   URGENT       Comments: To assess prior glycemic control    02/17/23 0124            Vitals/Pain Today's Vitals   02/17/23 1034 02/17/23 1223 02/17/23 1330 02/17/23 1342  BP:   (!) 144/89 (!) 144/89  Pulse:   (!) 44 (!) 52  Resp:    18  Temp:  97.9 F (36.6 C)    TempSrc:  Oral    SpO2:   100% 100%  Weight:      Height:      PainSc: 10-Worst pain ever       Isolation Precautions No active isolations  Medications Medications  labetalol (NORMODYNE) injection 10 mg (has no administration in time range)  LORazepam (ATIVAN) injection 1 mg (1 mg Intravenous Given 02/16/23 2352)   stroke: early stages of recovery book (has no administration in time range)  clopidogrel (PLAVIX) tablet 75 mg (has no administration in time range)  enoxaparin (LOVENOX) injection 40 mg (40 mg Subcutaneous Given 02/17/23 1035)  prochlorperazine (COMPAZINE) injection 5 mg (  has no administration in time range)  amLODipine (NORVASC) tablet 5 mg (5 mg Oral Given 02/17/23 0759)  atorvastatin (LIPITOR) tablet 80 mg (80 mg Oral Given 02/17/23 1035)   oxyCODONE-acetaminophen (PERCOCET/ROXICET) 5-325 MG per tablet 1 tablet (1 tablet Oral Given 02/17/23 1035)  hydrALAZINE (APRESOLINE) injection 10 mg (10 mg Intravenous Given 02/16/23 2310)  iohexol (OMNIPAQUE) 350 MG/ML injection 75 mL (75 mLs Intravenous Contrast Given 02/17/23 0213)    Mobility non-ambulatory

## 2023-02-17 NOTE — Evaluation (Signed)
Physical Therapy Evaluation Patient Details Name: Lindsey Mercer MRN: [id number] DOB: 1960/05/03 Today's Date: 02/17/2023  History of Present Illness  Patient 62 yo female presents to the ED 02/16/2023 due to R side HA, N and V x 4 days, and inability to ambulate IND since 12/26. Pt underwent stroke work up, CT of head and neck reveal occulusion in R PICA, MRI of brain large acute infarct of the inferior right cerebellar hemisphere with mass effect within the posterior fossa and narrowing of the fourth ventricle inferiorly and small amount of acute ischemia within the medial left cerebellum. Pt has hx of poor medication management with pt reportedly stopped taking medication for high blood pressure 4 months ago. Pt PMH includes but is not limited to: HTN, DJD, allergy, HLD and arthritis.  Clinical Impression   Pt admitted with above diagnosis.  Pt currently with functional limitations due to the deficits listed below (see PT Problem List). Pt in bed when PT arrived. Pt agreeable to therapy intervention. Pt initiated supine to sit to the L side and reported sudden onset of dizziness like the whole room was spinning and unable to progress then reported nausea. PT and OT noted L eye nystagmus with L head turn. Pt indicated dizziness subsided and then agreeable to sitting EOB. Pt required max A x 2 for bed mobility tasks. Once seated EOB pt became diaphoretic and reported needing to return to bed. Pt left in bed, roommate/friend present and all needs in place. Please see below for Bp findings. Patient will benefit from continued inpatient follow up therapy, <3 hours/day. Pt will benefit from acute skilled PT to increase their independence and safety with mobility to allow discharge.    Blood pressures, indicative of symptomatic orthostatic hypotension  Supine: 142/89 mmhg Sitting: 114/79 mmhg Returned to supine:161/89 mmhg       If plan is discharge home, recommend the following: A lot of help with  walking and/or transfers;A lot of help with bathing/dressing/bathroom;Assistance with cooking/housework;Assist for transportation;Help with stairs or ramp for entrance   Can travel by private vehicle        Equipment Recommendations Rolling walker (2 wheels)  Recommendations for Other Services       Functional Status Assessment Patient has had a recent decline in their functional status and demonstrates the ability to make significant improvements in function in a reasonable and predictable amount of time.     Precautions / Restrictions Precautions Precautions: Fall Restrictions Weight Bearing Restrictions Per Provider Order: No      Mobility  Bed Mobility Overal bed mobility: Needs Assistance Bed Mobility: Supine to Sit, Sit to Supine     Supine to sit: +2 for safety/equipment, Max assist Sit to supine: Max assist, +2 for safety/equipment, +2 for physical assistance   General bed mobility comments: pt limited with bed mobility at time of eval reporting dizziness and mild nausea    Transfers                   General transfer comment: NT    Ambulation/Gait               General Gait Details: NT  Stairs            Wheelchair Mobility     Tilt Bed    Modified Rankin (Stroke Patients Only)       Balance Overall balance assessment: Needs assistance Sitting-balance support: Feet unsupported, Bilateral upper extremity supported Sitting balance-Leahy Scale: Poor  Pertinent Vitals/Pain Pain Assessment Pain Assessment: No/denies pain    Home Living Family/patient expects to be discharged to:: Private residence Living Arrangements: Non-relatives/Friends Lexicographer) Available Help at Discharge: Friend(s) Type of Home: House Home Access: Level entry       Home Layout: One level Home Equipment: None      Prior Function Prior Level of Function : Independent/Modified Independent              Mobility Comments: IND no AD for all ADLs, self care tasks and IADLs       Extremity/Trunk Assessment   Upper Extremity Assessment Upper Extremity Assessment: Defer to OT evaluation    Lower Extremity Assessment Lower Extremity Assessment: Overall WFL for tasks assessed    Cervical / Trunk Assessment Cervical / Trunk Assessment: Normal  Communication   Communication Communication: No apparent difficulties  Cognition Arousal: Alert Behavior During Therapy: WFL for tasks assessed/performed Overall Cognitive Status: Within Functional Limits for tasks assessed                                          General Comments      Exercises     Assessment/Plan    PT Assessment Patient needs continued PT services  PT Problem List Decreased activity tolerance;Decreased balance;Decreased mobility       PT Treatment Interventions DME instruction;Gait training;Stair training;Functional mobility training;Therapeutic activities;Balance training;Therapeutic exercise;Neuromuscular re-education;Patient/family education    PT Goals (Current goals can be found in the Care Plan section)  Acute Rehab PT Goals Patient Stated Goal: to be able to go home to celebrate new years PT Goal Formulation: With patient Time For Goal Achievement: 02/28/23 Potential to Achieve Goals: Good    Frequency Min 1X/week     Co-evaluation PT/OT/SLP Co-Evaluation/Treatment: Yes Reason for Co-Treatment: For patient/therapist safety;To address functional/ADL transfers PT goals addressed during session: Mobility/safety with mobility OT goals addressed during session: ADL's and self-care       AM-PAC PT "6 Clicks" Mobility  Outcome Measure Help needed turning from your back to your side while in a flat bed without using bedrails?: A Little Help needed moving from lying on your back to sitting on the side of a flat bed without using bedrails?: A Lot Help needed moving to and  from a bed to a chair (including a wheelchair)?: Total Help needed standing up from a chair using your arms (e.g., wheelchair or bedside chair)?: Total Help needed to walk in hospital room?: Total Help needed climbing 3-5 steps with a railing? : Total 6 Click Score: 9    End of Session   Activity Tolerance: Treatment limited secondary to medical complications (Comment) (dizziness and nausea) Patient left: in bed;with call bell/phone within reach;with family/visitor present Nurse Communication: Mobility status PT Visit Diagnosis: Unsteadiness on feet (R26.81);Other abnormalities of gait and mobility (R26.89);Muscle weakness (generalized) (M62.81);Difficulty in walking, not elsewhere classified (R26.2);Dizziness and giddiness (R42)    Time: 1152-1207 PT Time Calculation (min) (ACUTE ONLY): 15 min   Charges:   PT Evaluation $PT Eval Moderate Complexity: 1 Mod   PT General Charges $$ ACUTE PT VISIT: 1 Visit         Johnny Bridge, PT Acute Rehab    Jacqualyn Posey 02/17/2023, 1:34 PM

## 2023-02-17 NOTE — Evaluation (Signed)
Occupational Therapy Evaluation Patient Details Name: Lindsey Mercer MRN: [id number] DOB: 1960-07-15 Today's Date: 02/17/2023   History of Present Illness Patient 62 yo female presents to the ED 02/16/2023 due to R side HA, N and V x 4 days, and inability to ambulate IND since 12/26. Pt underwent stroke work up, CT of head and neck reveal occulusion in R PICA, MRI of brain large acute infarct of the inferior right cerebellar hemisphere with mass effect within the posterior fossa and narrowing of the fourth ventricle inferiorly and small amount of acute ischemia within the medial left cerebellum. Pt has hx of poor medication management with pt reportedly stopped taking medication for high blood pressure 4 months ago. Pt PMH includes but is not limited to: HTN, DJD, allergy, HLD and arthritis.   Clinical Impression   Patient is a 62 year old female who was admitted for above. Patient was living at home with roommate with independence in ADLs. Patient was severely limited during session with dizziness with all movement. Patient noted to have nystagmus in L eye with rolling in bed especially to L. Patient noted to have BP drop with sitting EOB with onset of diaphoresis. Patient was noted to have decreased functional activity tolernace, decreased ROM, decreased BUE strength, decreased endurance, decreased sitting balance, decreased standing balanced, decreased safety awareness, and decreased knowledge of AE/AD impacting participation in ADLs. Patient would continue to benefit from skilled OT services at this time while admitted and after d/c to address noted deficits in order to improve overall safety and independence in ADLs. Patient will benefit from continued inpatient follow up therapy, <3 hours/day    Blood pressures Supine: 142/89 mmhg Sitting: 114/79 mmhg Supine:161/89 mmhg      If plan is discharge home, recommend the following: Two people to help with walking and/or transfers;Assistance with  cooking/housework;Direct supervision/assist for medications management;Assist for transportation;Help with stairs or ramp for entrance;Direct supervision/assist for financial management    Functional Status Assessment  Patient has had a recent decline in their functional status and demonstrates the ability to make significant improvements in function in a reasonable and predictable amount of time.  Equipment Recommendations  None recommended by OT       Precautions / Restrictions Precautions Precautions: Fall Restrictions Weight Bearing Restrictions Per Provider Order: No      Mobility Bed Mobility Overal bed mobility: Needs Assistance Bed Mobility: Supine to Sit, Sit to Supine     Supine to sit: +2 for safety/equipment, Max assist Sit to supine: Max assist, +2 for safety/equipment, +2 for physical assistance             Balance Overall balance assessment: Needs assistance Sitting-balance support: Feet unsupported, Bilateral upper extremity supported Sitting balance-Leahy Scale: Poor           ADL either performed or assessed with clinical judgement   ADL Overall ADL's : Needs assistance/impaired Eating/Feeding: Minimal assistance Eating/Feeding Details (indicate cue type and reason): ed on sitting upright for meals Grooming: Bed level Grooming Details (indicate cue type and reason): severely limited with dizziness at this time Upper Body Bathing: Bed level;Maximal assistance   Lower Body Bathing: Bed level;Maximal assistance   Upper Body Dressing : Bed level;Minimal assistance   Lower Body Dressing: Bed level;Maximal assistance Lower Body Dressing Details (indicate cue type and reason): unable to figure four at bed level Toilet Transfer: +2 for safety/equipment;Maximal assistance Toilet Transfer Details (indicate cue type and reason): unable to sith EOB with diapharisis and dizziness Toileting- Clothing Manipulation  and Hygiene: Total assistance;Bed level                Vision Patient Visual Report: Nausea/blurring vision with head movement Additional Comments: dizziness with minimal movement.nystagmus in L eye with rolling in bed especially  to L side.            Pertinent Vitals/Pain Pain Assessment Pain Assessment: No/denies pain     Extremity/Trunk Assessment Upper Extremity Assessment Upper Extremity Assessment: Overall WFL for tasks assessed;Right hand dominant   Lower Extremity Assessment Lower Extremity Assessment: Defer to PT evaluation          Cognition Arousal: Alert Behavior During Therapy: [facility] for tasks assessed/performed                          Home Living Family/patient expects to be discharged to:: Private residence Living Arrangements: Non-relatives/Friends (roomate)   Type of Home: House Home Access: Level entry     Home Layout: One level     Bathroom Shower/Tub: Tub/shower unit         Home Equipment: None          Prior Functioning/Environment Prior Level of Function : Independent/Modified Independent                        OT Problem List: Decreased activity tolerance;Cardiopulmonary status limiting activity;Decreased knowledge of use of DME or AE;Decreased knowledge of precautions;Impaired vision/perception;Decreased coordination;Decreased safety awareness;Impaired balance (sitting and/or standing)      OT Treatment/Interventions: Self-care/ADL training;Therapeutic exercise;DME and/or AE instruction;Therapeutic activities;Patient/family education;Balance training    OT Goals(Current goals can be found in the care plan section) Acute Rehab OT Goals Patient Stated Goal: to go home OT Goal Formulation: With patient/family Time For Goal Achievement: 03/03/23 Potential to Achieve Goals: Fair  OT Frequency: Min 1X/week    Co-evaluation PT/OT/SLP Co-Evaluation/Treatment: Yes Reason for Co-Treatment: For patient/therapist safety;To address functional/ADL transfers PT  goals addressed during session: Mobility/safety with mobility OT goals addressed during session: ADL's and self-care      AM-PAC OT "6 Clicks" Daily Activity     Outcome Measure Help from another person eating meals?: A Little Help from another person taking care of personal grooming?: A Little Help from another person toileting, which includes using toliet, bedpan, or urinal?: Total Help from another person bathing (including washing, rinsing, drying)?: Total Help from another person to put on and taking off regular upper body clothing?: A Lot Help from another person to put on and taking off regular lower body clothing?: Total 6 Click Score: 11   End of Session    Activity Tolerance: Other (comment) (orthostatic and diapharsis) Patient left: in bed;with call bell/phone within reach;with family/visitor present (in ED)  OT Visit Diagnosis: Unsteadiness on feet (R26.81);Other abnormalities of gait and mobility (R26.89);Dizziness and giddiness (R42)                Time: 4098-1191 OT Time Calculation (min): 18 min Charges:  OT General Charges $OT Visit: 1 Visit OT Evaluation $OT Eval Low Complexity: 1 Low  Rama Mcclintock OTR/L, MS Acute Rehabilitation Department Office# [phone]   Selinda Flavin 02/17/2023, 12:42 PM

## 2023-02-17 NOTE — Consult Note (Addendum)
NEUROLOGY CONSULT NOTE   Date of service: February 17, 2023 Patient Name: Lindsey Mercer MRN:  [id number] DOB:  02/11/1961 Chief Complaint: "Gait ataxia" Requesting Provider: Rexford Maus, DO  History of Present Illness  Lindsey Mercer is a 63 y.o. female  has a past medical history of Allergy, Arthritis, DJD (degenerative joint disease), and Hypertension.   Lindsey Mercer is a 62 y.o. female  has a past medical history of Allergy, Arthritis, DJD (degenerative joint disease), and Hypertension.  Presented to the emergency department was in the hospital for evaluation of gait disturbance for 4 days.  She reports that she has a history of high blood pressure and felt that was better controlled and stopped taking her medications about 4 months ago.  Last week she started having nausea and overall not feeling well.  Also complaining of right-sided headache for the past 3 to 4 days now having some chest pressure when she checked into the ER. CT head head with no evidence of bleed but demonstrated large acute infarct of the right inferior cerebellar hemisphere.  MRI brain confirmed a cerebellar stroke. Neurology consulted for the stroke   LKW: 4 days ago Modified rankin score: 0-Completely asymptomatic and back to baseline post- stroke IV Thrombolysis: Outside the window EVT: No-outside the window  NIHSS components Score: Comment  1a Level of Conscious 0[x]  1[]  2[]  3[]      1b LOC Questions 0[x]  1[]  2[]       1c LOC Commands 0[x]  1[]  2[]       2 Best Gaze 0[x]  1[]  2[]       3 Visual 0[x]  1[]  2[]  3[]      4 Facial Palsy 0[]  1[x]  2[]  3[]      5a Motor Arm - left 0[x]  1[]  2[]  3[]  4[]  UN[]    5b Motor Arm - Right 0[x]  1[]  2[]  3[]  4[]  UN[]    6a Motor Leg - Left 0[x]  1[]  2[]  3[]  4[]  UN[]    6b Motor Leg - Right 0[x]  1[]  2[]  3[]  4[]  UN[]    7 Limb Ataxia 0[x]  1[]  2[]  3[]  UN[]     8 Sensory 0[x]  1[]  2[]  UN[]      9 Best Language 0[x]  1[]  2[]  3[]      10 Dysarthria 0[x]  1[]  2[]  UN[]      11 Extinct. and  Inattention 0[x]  1[]  2[]       TOTAL: 1      ROS  Comprehensive ROS performed and pertinent positives documented in HPI   Past History   Past Medical History:  Diagnosis Date   Allergy    Arthritis    DJD (degenerative joint disease)    Hypertension     Past Surgical History:  Procedure Laterality Date   ABDOMINAL HYSTERECTOMY     1993    Family History: Family History  Problem Relation Age of Onset   Arthritis Mother    Breast cancer Mother    Hypertension Mother    Cancer Paternal Aunt        breast   Arthritis Father    Hypertension Father    Arthritis Other    Breast cancer Sister    Hypertension Sister     Social History  reports that she has quit smoking. She has never used smokeless tobacco. She reports current alcohol use. She reports that she does not use drugs.  Allergies  Allergen Reactions   Aspirin Hives and Nausea Only   Lisinopril     REACTION: Dry cough   Penicillins     REACTION: hives  Sulfa Antibiotics Hives    Medications   Current Facility-Administered Medications:    labetalol (NORMODYNE) injection 10 mg, 10 mg, Intravenous, Q2H PRN, Fayrene Helper, PA-C   LORazepam (ATIVAN) injection 1 mg, 1 mg, Intravenous, Q4H PRN, Fayrene Helper, PA-C, 1 mg at 02/26/2023 2352   oxyCODONE-acetaminophen (PERCOCET/ROXICET) 5-325 MG per tablet 1 tablet, 1 tablet, Oral, Q30 min PRN, Theresia Lo, Victoria K, DO, 1 tablet at 02-26-2023 1719  Current Outpatient Medications:    cloNIDine (CATAPRES) 0.1 MG tablet, Take 1 tablet (0.1 mg total) by mouth 2 (two) times daily., Disp: 60 tablet, Rfl: 0   rizatriptan (MAXALT-MLT) 5 MG disintegrating tablet, Take 1 tablet (5 mg total) by mouth as needed for migraine. May repeat in 2 hours if needed, Disp: 10 tablet, Rfl: 0  Vitals   Vitals:   02-26-2023 2039 26-Feb-2023 2214 02-26-2023 2310 02/17/23 0040  BP: (!) 155/91 (!) 176/102 (!) 166/127 (!) 174/95  Pulse: 64 74 61 89  Resp: 16 16 20 20   Temp: 98.2 F (36.8 C) 97.9 F  (36.6 C)    TempSrc: Oral Oral    SpO2: 99% 100% 100% 100%  Weight:      Height:        Body mass index is 35.77 kg/m.  Physical Exam  General: Comfortably sleeping in bed, in no distress HEENT: Normocephalic/atraumatic Chest:Clear Cardiovascular: Regular rhythm Abdomen nondistended nontender Neurological exam Awake alert oriented x 3 after waking up from sleep. No dysarthria Aphasia Cranial nerves II to XII: Mild right facial asymmetry otherwise unremarkable. Motor examination: With no drift Sensation intact light touch Coordination with no dysmetria  Labs/Imaging/Neurodiagnostic studies   CBC:  Recent Labs  Lab 2023/02/26 1717 February 26, 2023 2315  WBC 11.1*  --   NEUTROABS  --  9.8*  HGB 15.6*  --   HCT 48.5*  --   MCV 79.5*  --   PLT 372  --    Basic Metabolic Panel:  Lab Results  Component Value Date   NA 136 Feb 26, 2023   K 4.0 Feb 26, 2023   CO2 23 02/26/2023   GLUCOSE 98 02-26-2023   BUN 24 (H) 02-26-2023   CREATININE 1.05 (H) 02/26/23   CALCIUM 9.2 February 26, 2023   GFRNONAA >60 2023-02-26   GFRAA 69 (L) 08/10/2012   Urine Drug Screen:     Component Value Date/Time   LABOPIA NONE DETECTED 02/26/23 2341   COCAINSCRNUR POSITIVE (A) 02-26-2023 2341   LABBENZ NONE DETECTED 2023/02/26 2341   AMPHETMU NONE DETECTED 2023-02-26 2341   THCU NONE DETECTED 02/26/2023 2341   LABBARB NONE DETECTED 02-26-23 2341    Alcohol Level     Component Value Date/Time   ETH <10 02-26-2023 2315   INR  Lab Results  Component Value Date   INR 1.0 02/26/2023   APTT  Lab Results  Component Value Date   APTT 31 26-Feb-2023   CT Head without contrast(Personally reviewed): Large right cerebellar subacute infarct   MRI Brain(Personally reviewed): Confirms the large right inferior cerebellar infarct  ASSESSMENT   Lindsey Mercer is a 62 y.o. female  has a past medical history of Allergy, Arthritis, DJD (degenerative joint disease), and Hypertension.  Not on  hypertension medications for the past 4 months presenting for evaluation of gait ataxia for the last 4 days.  MRI of the brain reveals a large right cerebellar subacute infarct. Admission recommended for further stroke risk factor workup.  Impression: Acute/subacute ischemic right cerebellar infarct  RECOMMENDATIONS  Admit to hospitalist Frequent rechecks Telemetry No need  for permissive hypertension.  Goal blood pressure should be normotension at this time as she is over 4 days from her last known well.  If she is extremely hypertensive, I would bring the blood pressures down about 20% each day with goal blood pressure of 140/90 at discharge. 2D echo A1c-goal SSI Lipid panel-high intensity statin for goal LDL less than 70 CT angiography head and neck Allergic to aspirin-hives.  Start Plavix 75. PT OT Speech therapy  Initial plan discussed with PA Laveda Norman when the recommendation was made to transfer sooner rather than later given the large appearance on CT but after examining her at [facility], there is no urgency in transfer and she can come in through a regular transfer process direct admission to the hospitalist from [facility]. This was communicated to Dr. Wilkie Aye. Stroke team to follow.   ______________________________________________________________________    Dene Gentry, MD [facility]

## 2023-02-17 NOTE — Progress Notes (Signed)
[facility] PLAN OF CARE NOTE Patient: Lindsey Mercer BJY:[id number]   PCP: Pcp, No DOB: 28-Mar-1960   DOA: 02/16/2023   DOS: 02/17/2023    Patient was admitted by my colleague earlier on 02/17/2023. I have reviewed the H&P as well as assessment and plan and agree with the same. Important changes in the plan are listed below.  Plan of care: Principal Problem:   Acute CVA (cerebrovascular accident) [facility]) Patient originally presented with headache and nausea and vomiting.  While in the ED she also had some chest pain.  She also reported some tingling of the right side of the face with dizziness and vertigo.  Had some facial droop on the right. Symptoms primarily ongoing for 3 to 4 days. Underwent CT of the head which showed subacute infarct in the right cerebellar area. MRI confirmed the stroke.  There is some mass effect in the posterior fossa and narrowing of the fourth ventricle but no evidence of hydrocephalus. Also acute stroke seen in the left cerebellar area. Examination essentially remains unchanged on 12/30. Repeat CT of the head on 12/30 shows evolving stroke, slightly increased mass effect without any hydrocephalus. Patient will be transferred to [facility] for neurology evaluation. CT angio head and neck shows evidence of right PICA occlusion.  Also moderate right glenoid ICA stenosis. Neurology was notified of patient's arrival to [facility]. Allow permissive hypertension.  Level of care: Progressive  Author: Lynden Oxford, MD  [facility] 02/17/2023 6:43 PM   If 7PM-7AM, please contact night-coverage at [email or web address]

## 2023-02-17 NOTE — ED Notes (Signed)
Pt reporting she has been unable to walk on her own since Thursday of this week, when she was previously able to.

## 2023-02-18 DIAGNOSIS — I639 Cerebral infarction, unspecified: Secondary | ICD-10-CM | POA: Diagnosis not present

## 2023-02-18 LAB — GLUCOSE, CAPILLARY
Glucose-Capillary: 104 mg/dL — ABNORMAL HIGH (ref 70–99)
Glucose-Capillary: 100 mg/dL — ABNORMAL HIGH (ref 70–99)

## 2023-02-18 LAB — HEMOGLOBIN A1C
Hgb A1c MFr Bld: 6.3 % — ABNORMAL HIGH (ref 4.8–5.6)
Mean Plasma Glucose: 134 mg/dL

## 2023-02-18 MED ORDER — TRAMADOL HCL 50 MG PO TABS
50.0000 mg | ORAL_TABLET | Freq: Four times a day (QID) | ORAL | Status: DC | PRN
  Administered 2023-02-18 – 2023-02-20 (×5): 50 mg via ORAL
  Filled 2023-02-18 (×5): qty 1

## 2023-02-18 MED ORDER — INSULIN ASPART 100 UNIT/ML IJ SOLN: 0.0000 [IU] | Freq: Every day | INTRAMUSCULAR | Status: DC

## 2023-02-18 MED ORDER — INSULIN ASPART 100 UNIT/ML IJ SOLN
0.0000 [IU] | Freq: Three times a day (TID) | INTRAMUSCULAR | Status: DC
Start: 2023-02-18 — End: 2023-02-20

## 2023-02-18 NOTE — Progress Notes (Signed)
 [facility] PROGRESS NOTE  Lindsey Mercer (DOB: 09-07-1960) FMW:[id number] PCP: Pcp, No  Brief Narrative: Lindsey Mercer is a 62 y.o. female with a history of HTN who stopped taking medications 4 months ago and presented to the ED on 02/16/2023 for 4 days of gait instability, nausea, right-sided headache. CT head demonstrated a large acute infarct of the right inferior cerebellar hemisphere. MRI confirmed cerebellar stroke and CTA showed right PICA occlusion. UDS +cocaine. She was admitted with neurology consultation and stroke team following at [facility].   Subjective: Symptoms stable, still some headache but not too bad. Gets lightheaded when sitting then standing with therapy yesterday, hasn't been OOB yet today. Asks questions about her diet recommendations going forward.   Objective: BP (!) 145/77 (BP Location: Left Arm)   Pulse 90   Temp 98.4 F (36.9 C) (Oral)   Resp 18   Ht 5' 5 (1.651 m)   Wt 97.5 kg   SpO2 98%   BMI 35.77 kg/m   Gen: No distress Pulm: Clear, nonlabored  CV: RRR, no MRG or pitting edema GI: Soft, NT, ND, +BS  Neuro: Alert and oriented, slow R side finger to nose, some nausea with end point nystagmus noted. No focal weakness. Ext: Warm, no deformities. 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 - Risk factor modification as below, also noted numerous chronic microhemorrhages suggestive of hypertensive angiopathy - Continue cardiac monitoring, no AFib currently noted.  - Deficits persist, SNF is recommended. TOC consulted  HTN, uncontrolled:  - Continue norvasc  5mg , long-term goal normotensive. Avoid beta blockers.  Prediabetes: HbA1c 6.3%.  - SSI for now - Would likely benefit from metformin at discharge. - RD consulted  Hyperlipidemia: LDL 160.  - Continue (new) high intensity  statin.  Cocaine use:  - Cessation counseling provided.  History of migraine:  - Avoid triptans and cocaine as above  Obesity: Body mass index is 35.77 kg/m.   Bernardino KATHEE Come, MD [facility] [email or web address] 02/18/2023, 1:15 PM

## 2023-02-18 NOTE — Progress Notes (Signed)
 PT Cancellation Note  Patient Details Name: Lindsey Mercer MRN: [id number] DOB: 1960/11/09   Cancelled Treatment:    Reason Eval/Treat Not Completed: Medical issues which prohibited therapy  Patient reporting very nauseated from taking tylenol . It always makes me feel sick. Denies nausea earlier today. Pt pleading to defer therapy at this time. Explained risks of remaining in bed. Pt stated she just couldn't do it. Agrees to work with us  next time.    Macario RAMAN, PT Acute Rehabilitation Services  Office [phone]  Macario SHAUNNA Soja 02/18/2023, 3:57 PM

## 2023-02-18 NOTE — Plan of Care (Signed)
  Problem: Education: Goal: Knowledge of disease or condition will improve Outcome: Progressing   Problem: Self-Care: Goal: Ability to communicate needs accurately will improve Outcome: Progressing   Problem: Nutrition: Goal: Dietary intake will improve Outcome: Progressing

## 2023-02-18 NOTE — Progress Notes (Signed)
 Speech Language Pathology Evaluation Patient Details Name: Lindsey Mercer MRN: [id number] DOB: December 18, 1960 Today's Date: 02/18/2023 Time: 9182-9147 SLP Time Calculation (min) (ACUTE ONLY): 35 min  Problem List:  Patient Active Problem List   Diagnosis Date Noted   Acute CVA (cerebrovascular accident) (HCC) 02/17/2023   Breast lump on right side at 1 o'clock position 08/31/2013   Breast lump on left side at 6 o'clock position 12/16/2011   CORTICAL CATARACTS 07/25/2009   TOBACCO USER 10/24/2008   Lumbago 06/29/2008   Personal history presenting hazards to health 03/03/2008   PAIN, CHRONIC NEC 12/12/2006   OBESITY 11/24/2006   HOT FLASHES 11/24/2006   Allergic rhinitis 10/07/2006   SYNDROME, CHRONIC PAIN 07/30/2006   Brachial neuritis or radiculitis 06/27/2006   HYPERCHOLESTEROLEMIA 04/17/2006   DEPRESSIVE DISORDER, NOS 04/17/2006   HYPERTENSION, BENIGN SYSTEMIC 04/17/2006   ARTHRITIS, DEGENERATIVE 04/17/2006   Past Medical History:  Past Medical History:  Diagnosis Date   Allergy    Arthritis    DJD (degenerative joint disease)    Hypertension    Past Surgical History:  Past Surgical History:  Procedure Laterality Date   ABDOMINAL HYSTERECTOMY     1993   HPI:  Pt is a 62 yo female adm to [facility] with strokelike symptoms for 2 days prior to admission. Pt found to have cerebellar CVA - large right with mass effect and left subacute.  Pt symptoms included n/v and right facial tingling.  Pt passed a 3 ounce [facility] swallow screen and Speech eval ordered.  She lives with her mate, Lindsey Mercer, and is retired.  She manages home duties including cooking, cleaning, bills, medications, etc.  Pt admits to some premorbid memory deficits but denies worsening.  Pt denies having facial tingling prior to admit. She did not take her blood pressure medication prior to CVA- stating that this occurred because she felt good.  Assessment / Plan / Recommendation Clinical Impression  Pt with functional  language - expressive and receptive during evaluation for her tasks required at home. -Two episodes of word finding difficulty.  Repetition intact and phonation/speech are clear. [employer] Mental Status exam administered (absent written portion), with pt scoring 14/26 - indicative of cognitive impairment.  She self reports problems with memory prior to admission.  Strengths noted in areas of orientation, organization of words and functional math question.  Difficulties noted in areas of recall - recalling 2/5 words independently, 2/5 with various cue, did not recall 1/5 regardless of cue.  In addition, she only correctly answered 1/4 questions from narrative.  Impaired attention appeared to significantly impact pt - and she was distracted by desire to go home and her nausea.  SLP advised that pt have her mate *Lindsey Mercer* double check to assure bills, medications, etc are being managed at home.  She may benefit from follow up via [facility] if pt is agreeable to help maximize her rehab and decrease caregiver burden. Pt reports her mate stays home and can provide her assist as needed.    SLP Assessment  SLP Recommendation/Assessment: All further Speech Lanaguage Pathology  needs can be addressed in the next venue of care SLP Visit Diagnosis: Cognitive communication deficit (R41.841)    Recommendations for follow up therapy are one component of a multi-disciplinary discharge planning process, led by the attending physician.  Recommendations may be updated based on patient status, additional functional criteria and insurance authorization.    Follow Up Recommendations  Home health SLP    Assistance Recommended at Discharge  N/a  Functional Status Assessment Patient has had a recent decline in their functional status and demonstrates the ability to make significant improvements in function in a reasonable and predictable amount of time.  Frequency and Duration   N/a        SLP Evaluation Cognition   Overall Cognitive Status: Impaired/Different from baseline Arousal/Alertness: Awake/alert Orientation Level: Oriented X4 Year: 2024 Month: December Day of Week: Correct Attention: Selective Selective Attention: Impaired Selective Attention Impairment: Verbal complex Memory: Appears intact Awareness: Impaired Awareness Impairment: Other (comment) (no family present to establish baseline but suspect some discoordination of thoughts) Problem Solving: Impaired Problem Solving Impairment: Verbal complex (functional math question)       Comprehension  Auditory Comprehension Overall Auditory Comprehension: Appears within functional limits for tasks assessed Yes/No Questions: Not tested Commands: Within Functional Limits Conversation: Complex Interfering Components: Working Radio Broadcast Assistant: Repetition Counsellor: Not tested Reading Comprehension Reading Status: Not tested    Expression Expression Primary Mode of Expression: Verbal Verbal Expression Overall Verbal Expression: Appears within functional limits for tasks assessed Initiation: No impairment Level of Generative/Spontaneous Verbalization: Sentence;Conversation Repetition: No impairment Naming:  (difficulty with xylophone) Pragmatics: No impairment Non-Verbal Means of Communication: Not applicable Written Expression Dominant Hand: Right Written Expression: Not tested   Oral / Motor  Oral Motor/Sensory Function Overall Oral Motor/Sensory Function: Within functional limits Motor Speech Overall Motor Speech: Appears within functional limits for tasks assessed Respiration: Within functional limits Phonation: Normal Resonance: Within functional limits Articulation: Within functional limitis Intelligibility: Intelligible Motor Planning: Witnin functional limits Motor Speech Errors: Not applicable            Lindsey Mercer 02/18/2023, 9:35 AM  Lindsey POUR, MS [facility]  SLP Acute Rehab Services Office [phone]

## 2023-02-18 NOTE — Progress Notes (Signed)
 STROKE TEAM PROGRESS NOTE   SUBJECTIVE (INTERVAL HISTORY) Her daughter is at the bedside.  Overall her condition is stable. She she is doing better and denies dizziness or nausea today.   OBJECTIVE Temp:  [97.9 F (36.6 C)-98.7 F (37.1 C)] 98.4 F (36.9 C) (12/31 0758) Pulse Rate:  [70-95] 95 (12/31 0800) Cardiac Rhythm: Normal sinus rhythm (12/31 0700) Resp:  [16-19] 19 (12/31 0800) BP: (131-154)/(75-89) 131/81 (12/31 0800) SpO2:  [98 %-100 %] 98 % (12/31 0400)  No results for input(s): GLUCAP in the last 168 hours. Recent Labs  Lab 02/16/23 1717 02/16/23 2315 02/17/23 0630  NA 139 136  --   K 3.9 4.0  --   CL 106 102  --   CO2 19* 23  --   GLUCOSE 115* 98  --   BUN 21 24*  --   CREATININE 0.89 1.05* 0.85  CALCIUM  9.2 9.2  --    Recent Labs  Lab 02/16/23 2315  AST 33  ALT 46*  ALKPHOS 78  BILITOT 1.0  PROT 8.8*  ALBUMIN 4.1   Recent Labs  Lab 02/16/23 1717 02/16/23 2315 02/17/23 0630  WBC 11.1*  --  11.7*  NEUTROABS  --  9.8*  --   HGB 15.6*  --  14.3  HCT 48.5*  --  43.6  MCV 79.5*  --  79.6*  PLT 372  --  329   No results for input(s): CKTOTAL, CKMB, CKMBINDEX, TROPONINI in the last 168 hours. Recent Labs    02/16/23 2315  LABPROT 13.8  INR 1.0   Recent Labs    02/16/23 2341  COLORURINE AMBER*  LABSPEC 1.020  PHURINE 5.0  GLUCOSEU NEGATIVE  HGBUR SMALL*  BILIRUBINUR NEGATIVE  KETONESUR NEGATIVE  PROTEINUR 30*  NITRITE POSITIVE*  LEUKOCYTESUR TRACE*       Component Value Date/Time   CHOL 235 (H) 02/17/2023 0515   TRIG 123 02/17/2023 0515   HDL 50 02/17/2023 0515   CHOLHDL 4.7 02/17/2023 0515   VLDL 25 02/17/2023 0515   LDLCALC 160 (H) 02/17/2023 0515   Lab Results  Component Value Date   HGBA1C 6.3 (H) 02/16/2023      Component Value Date/Time   LABOPIA NONE DETECTED 02/16/2023 2341   COCAINSCRNUR POSITIVE (A) 02/16/2023 2341   LABBENZ NONE DETECTED 02/16/2023 2341   AMPHETMU NONE DETECTED 02/16/2023 2341    THCU NONE DETECTED 02/16/2023 2341   LABBARB NONE DETECTED 02/16/2023 2341    Recent Labs  Lab 02/16/23 2315  ETH <10    I have personally reviewed the radiological images below and agree with the radiology interpretations.  ECHOCARDIOGRAM COMPLETE Result Date: 02/17/2023    ECHOCARDIOGRAM REPORT   Patient Name:   Lindsey Mercer Date of Exam: 02/17/2023 Medical Rec #:  [id number]      Height:       65.0 in Accession #:    [id number]     Weight:       214.9 lb Date of Birth:  [birth year]      BSA:          2.040 m Patient Age:    62 years       BP:           148/98 mmHg Patient Gender: F              HR:           73 bpm. Exam Location:  Inpatient Procedure: 2D Echo, Cardiac Doppler and Color Doppler Indications:  Stroke  History:        Patient has no prior history of Echocardiogram examinations.                 Risk Factors:Hypertension and Former Smoker.  Sonographer:    Ozell Free Referring Phys: [id number] ASHISH ARORA IMPRESSIONS  1. Left ventricular ejection fraction, by estimation, is 60 to 65%. The left ventricle has normal function. The left ventricle has no regional wall motion abnormalities. There is severe concentric left ventricular hypertrophy. Left ventricular diastolic  parameters are consistent with Grade I diastolic dysfunction (impaired relaxation). Elevated left ventricular end-diastolic pressure.  2. Right ventricular systolic function is normal. The right ventricular size is normal. There is normal pulmonary artery systolic pressure.  3. The mitral valve is normal in structure. No evidence of mitral valve regurgitation. No evidence of mitral stenosis.  4. The aortic valve is tricuspid. There is mild calcification of the aortic valve. Aortic valve regurgitation is not visualized. No aortic stenosis is present.  5. The inferior vena cava is normal in size with greater than 50% respiratory variability, suggesting right atrial pressure of 3 mmHg. FINDINGS  Left Ventricle: Left  ventricular ejection fraction, by estimation, is 60 to 65%. The left ventricle has normal function. The left ventricle has no regional wall motion abnormalities. The left ventricular internal cavity size was normal in size. There is  severe concentric left ventricular hypertrophy. Left ventricular diastolic parameters are consistent with Grade I diastolic dysfunction (impaired relaxation). Elevated left ventricular end-diastolic pressure. Right Ventricle: The right ventricular size is normal. No increase in right ventricular wall thickness. Right ventricular systolic function is normal. There is normal pulmonary artery systolic pressure. The tricuspid regurgitant velocity is 1.89 m/s, and  with an assumed right atrial pressure of 3 mmHg, the estimated right ventricular systolic pressure is 17.3 mmHg. Left Atrium: Left atrial size was normal in size. Right Atrium: Right atrial size was normal in size. Pericardium: There is no evidence of pericardial effusion. Mitral Valve: The mitral valve is normal in structure. No evidence of mitral valve regurgitation. No evidence of mitral valve stenosis. Tricuspid Valve: The tricuspid valve is normal in structure. Tricuspid valve regurgitation is trivial. No evidence of tricuspid stenosis. Aortic Valve: The aortic valve is tricuspid. There is mild calcification of the aortic valve. Aortic valve regurgitation is not visualized. No aortic stenosis is present. Aortic valve mean gradient measures 4.0 mmHg. Aortic valve peak gradient measures 8.9 mmHg. Aortic valve area, by VTI measures 2.10 cm. Pulmonic Valve: The pulmonic valve was normal in structure. Pulmonic valve regurgitation is not visualized. No evidence of pulmonic stenosis. Aorta: The aortic root is normal in size and structure. Venous: The inferior vena cava is normal in size with greater than 50% respiratory variability, suggesting right atrial pressure of 3 mmHg. IAS/Shunts: No atrial level shunt detected by color flow  Doppler.  LEFT VENTRICLE PLAX 2D LVIDd:         3.90 cm   Diastology LVIDs:         2.50 cm   LV e' medial:    3.26 cm/s LV PW:         1.80 cm   LV E/e' medial:  15.0 LV IVS:        1.55 cm   LV e' lateral:   4.03 cm/s LVOT diam:     2.00 cm   LV E/e' lateral: 12.1 LV SV:         39 LV SV Index:  19 LVOT Area:     3.14 cm  RIGHT VENTRICLE             IVC RV Basal diam:  3.80 cm     IVC diam: 1.30 cm RV S prime:     17.10 cm/s TAPSE (M-mode): 1.9 cm LEFT ATRIUM             Index        RIGHT ATRIUM           Index LA diam:        4.80 cm 2.35 cm/m   RA Area:     19.10 cm LA Vol (A2C):   20.8 ml 10.20 ml/m  RA Volume:   50.20 ml  24.61 ml/m LA Vol (A4C):   43.2 ml 21.18 ml/m LA Biplane Vol: 31.9 ml 15.64 ml/m  AORTIC VALVE AV Area (Vmax):    2.45 cm AV Area (Vmean):   2.00 cm AV Area (VTI):     2.10 cm AV Vmax:           149.00 cm/s AV Vmean:          88.200 cm/s AV VTI:            0.184 m AV Peak Grad:      8.9 mmHg AV Mean Grad:      4.0 mmHg LVOT Vmax:         116.00 cm/s LVOT Vmean:        56.200 cm/s LVOT VTI:          0.123 m LVOT/AV VTI ratio: 0.67  AORTA Ao Root diam: 3.00 cm Ao Asc diam:  3.40 cm MITRAL VALVE               TRICUSPID VALVE MV Area (PHT): 2.99 cm    TR Peak grad:   14.3 mmHg MV Decel Time: 254 msec    TR Vmax:        189.00 cm/s MV E velocity: 48.80 cm/s MV A velocity: 61.70 cm/s  SHUNTS MV E/A ratio:  0.79        Systemic VTI:  0.12 m                            Systemic Diam: 2.00 cm Annabella Scarce MD Electronically signed by Annabella Scarce MD Signature Date/Time: 02/17/2023/4:48:36 PM    Final    CT HEAD WO CONTRAST ( ) Result Date: 02/17/2023 CLINICAL DATA:  Stroke, follow-up. EXAM: CT HEAD WITHOUT CONTRAST TECHNIQUE: Contiguous axial images were obtained from the base of the skull through the vertex without intravenous contrast. RADIATION DOSE REDUCTION: This exam was performed according to the departmental dose-optimization program which includes automated exposure  control, adjustment of the mA and/or kV according to patient size and/or use of iterative reconstruction technique. COMPARISON:  MRI brain 02/17/2023.  Head CT 02/16/2023. FINDINGS: Brain: Evolving large acute infarct in the medial aspect of the right cerebellar hemisphere. No acute hemorrhage. Slightly increased mass effect on the inferior fourth ventricle. No acute hydrocephalus. 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 No extra-axial collection or midline shift. Vascular: No hyperdense vessel or unexpected calcification. Skull: No calvarial fracture or suspicious bone lesion. Skull base is unremarkable. Sinuses/Orbits: No acute findings. Other: None. IMPRESSION: Evolving large acute infarct in the medial aspect of the right cerebellar hemisphere. No acute hemorrhage. Slightly increased mass effect on the inferior  fourth ventricle. No acute hydrocephalus. Electronically Signed   By: Ryan Chess M.D.   On: 02/17/2023 09:58   CT ANGIO HEAD NECK W WO CM Result Date: 02/17/2023 CLINICAL DATA:  Acute neurologic deficit EXAM: CT ANGIOGRAPHY HEAD AND NECK WITH AND WITHOUT CONTRAST TECHNIQUE: Multidetector CT imaging of the head and neck was performed using the standard protocol during bolus administration of intravenous contrast. Multiplanar CT image reconstructions and MIPs were obtained to evaluate the vascular anatomy. Carotid stenosis measurements (when applicable) are obtained utilizing NASCET criteria, using the distal internal carotid diameter as the denominator. RADIATION DOSE REDUCTION: This exam was performed according to the departmental dose-optimization program which includes automated exposure control, adjustment of the mA and/or kV according to patient size and/or use of iterative reconstruction technique. CONTRAST:  75mL OMNIPAQUE  IOHEXOL  350 MG/ML SOLN COMPARISON:  Same day brain MRI FINDINGS: CTA NECK FINDINGS Skeleton: No  acute abnormality or high grade bony spinal canal stenosis. Other neck: Normal pharynx, larynx and major salivary glands. No cervical lymphadenopathy. Unremarkable thyroid gland. Upper chest: No pneumothorax or pleural effusion. No nodules or masses. Aortic arch: There is calcific atherosclerosis of the aortic arch. Normal variant aortic arch branching pattern with the brachiocephalic and left common carotid arteries sharing a common origin. RIGHT carotid system: No dissection, occlusion or aneurysm. Mild atherosclerotic calcification at the carotid bifurcation without hemodynamically significant stenosis. LEFT carotid system: No dissection, occlusion or aneurysm. There is mixed density atherosclerosis extending into the proximal ICA, resulting in less than 50% stenosis. Vertebral arteries: Right dominant configuration. There is no dissection, occlusion or flow-limiting stenosis to the skull base (V1-V3 segments). CTA HEAD FINDINGS POSTERIOR CIRCULATION: Vertebral arteries are normal. 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 Basilar artery is normal. Superior cerebellar arteries are normal. Posterior cerebral arteries are normal. ANTERIOR CIRCULATION: Right-greater-than-left atherosclerotic calcification with moderate stenosis of the right clinoid segment. Anterior cerebral arteries are normal. Middle cerebral arteries are normal. Venous sinuses: As permitted by contrast timing, patent. Anatomic variants: None Review of the MIP images confirms the above findings. 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 3. Bilateral carotid bifurcation atherosclerosis without hemodynamically significant stenosis by NASCET criteria. Aortic Atherosclerosis (ICD10-I70.0). Electronically Signed   By: Franky Stanford M.D.   On: 02/17/2023 02:50   MR BRAIN WO CONTRAST Result Date: 02/17/2023 CLINICAL DATA:  Nausea and vomiting EXAM: MRI HEAD WITHOUT CONTRAST TECHNIQUE: Multiplanar, multiecho pulse  sequences of the brain and surrounding structures were obtained without intravenous contrast. COMPARISON:  Head CT 02/16/2023 FINDINGS: Brain: There is a large acute infarct of the inferior right cerebellar hemisphere. Small amount of acute ischemia within the medial left cerebellum. No supratentorial diffusion abnormality. Numerous chronic microhemorrhages, predominantly central. There is mass effect within the posterior fossa with narrowing of the fourth ventricle inferiorly. No hydrocephalus. There is confluent hyperintense T2-weighted signal within the white matter. Generalized volume loss. The midline structures are normal. Vascular: Normal flow voids. Skull and upper cervical spine: Normal calvarium and skull base. Visualized upper cervical spine and soft tissues are normal. Sinuses/Orbits:No paranasal sinus fluid levels or advanced mucosal thickening. No mastoid or middle ear effusion. Normal orbits. IMPRESSION: 1. Large acute infarct of the inferior right cerebellar hemisphere with mass effect within the posterior fossa and narrowing of the fourth ventricle inferiorly. No hydrocephalus. 2. Small amount of acute ischemia within the medial left cerebellum. 3. Numerous chronic microhemorrhages, predominantly central, likely chronic hypertensive angiopathy Electronically Signed   By: Franky  Alexa M.D.   On: 02/17/2023 00:59   CT Head Wo Contrast Result Date: 02/16/2023 CLINICAL DATA:  Headache EXAM: CT HEAD WITHOUT CONTRAST TECHNIQUE: Contiguous axial images were obtained from the base of the skull through the vertex without intravenous contrast. RADIATION DOSE REDUCTION: This exam was performed according to the departmental dose-optimization program which includes automated exposure control, adjustment of the mA and/or kV according to patient size and/or use of iterative reconstruction technique. COMPARISON:  None Available. FINDINGS: Brain: There is an acute/early subacute infarct of the inferior medial  right cerebellum. Chronic ischemic white matter changes. Old right caudate head small vessel infarct. Vascular: Atherosclerotic calcification of the internal carotid arteries at the skull base. No abnormal hyperdensity of the major intracranial arteries or dural venous sinuses. Skull: The visualized skull base, calvarium and extracranial soft tissues are normal. Sinuses/Orbits: No fluid levels or advanced mucosal thickening of the visualized paranasal sinuses. No mastoid or middle ear effusion. Normal orbits. Other: None. 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 No hemorrhage or mass effect. Electronically Signed   By: Franky Alexa M.D.   On: 02/16/2023 23:07   DG Chest 2 View Result Date: 02/16/2023 CLINICAL DATA:  chest pain EXAM: CHEST - 2 VIEW COMPARISON:  02/26/2008. FINDINGS: The heart size and mediastinal contours are within normal limits. Both lungs are clear. No pneumothorax or pleural effusion. There are thoracic degenerative changes. IMPRESSION: No acute cardiopulmonary disease. Electronically Signed   By: Fonda Field M.D.   On: 02/16/2023 18:25     PHYSICAL EXAM  Temp:  [97.9 F (36.6 C)-98.7 F (37.1 C)] 98.4 F (36.9 C) (12/31 0758) Pulse Rate:  [70-95] 95 (12/31 0800) Resp:  [16-19] 19 (12/31 0800) BP: (131-154)/(75-89) 131/81 (12/31 0800) SpO2:  [98 %-100 %] 98 % (12/31 0400)  General - Well nourished, well developed, in no apparent distress.  Ophthalmologic - fundi not visualized due to noncooperation.  Cardiovascular - Regular rhythm and rate.  Mental Status -  Level of arousal and orientation to time, place, and person were intact. Language including expression, naming, repetition, comprehension was assessed and found intact. Fund of Knowledge was assessed and was intact.  Cranial Nerves II - XII - II - Visual field intact OU. III, IV, VI - Extraocular movements intact. V - Facial sensation intact bilaterally. VII -  Facial movement intact bilaterally. VIII - Hearing & vestibular intact bilaterally. Right gaze with unsustained horizontal nystagmus, direction to right X - Palate elevates symmetrically. XI - Chin turning & shoulder shrug intact bilaterally. XII - Tongue protrusion intact.  Motor Strength - The patient's strength was normal in all extremities and pronator drift was absent.  Bulk was normal and fasciculations were absent.   Motor Tone - Muscle tone was assessed at the neck and appendages and was normal.  Reflexes - The patient's reflexes were symmetrical in all extremities and she had no pathological reflexes.  Sensory - Light touch, temperature/pinprick were assessed and were symmetrical.    Coordination - The patient had normal movements in the hands and feet with no ataxia or dysmetria, mildly slow on the right FTN than left.  Tremor was absent.  Gait and Station - deferred.   ASSESSMENT/PLAN Ms. Lindsey Mercer is a 62 y.o. female with history of HTN, migraine, smoker admitted for imbalance, HA, nausea for 4 days. No TNK given due to outside window.    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 Pt has ASA allergy Patient counseled to be compliant with her antithrombotic medications Ongoing aggressive stroke risk factor management Therapy recommendations:  SNF Disposition:  pending  Hypertension Stable on the high end On amlodipine  5 Long term BP goal normotensive  Hyperlipidemia Home meds:  none  LDL 160, goal < 70 Now on lipitor  80 Continue statin at discharge  Tobacco  abuse Current smoker Smoking cessation counseling provided Pt is willing to quit  Cocaine abuse UDS positive for cocaine Cocaine cessation education provided Pt is willing to quit  Other Stroke Risk Factors Advanced age Obesity, Body mass index is 35.77 kg/m.  Migraine   Other Active Problems AKI, Cre 1.05->0.85 Mild leukocytosis WBC 11.7  Hospital day # 1 I have personally obtained history,examined this patient, reviewed notes, independently viewed imaging studies, participated in medical decision making and plan of care.ROS completed by me personally and pertinent positives fully documented  I have made any additions or clarifications directly to the above note. Agree with note above.  Recommend Plavix  alone for stroke prevention since patient has aspirin allergy.  Aggressive risk factor modification.  Patient counseled to quit using cocaine.  Transfer to rehab when bed available.  Stroke team will sign off.  Discussed with Dr. Bryn and patient and daughter.  Greater than 50% time during this 35-minute visit was spent on counseling and coordination of care about her stroke and discussion about secondary stroke prevention and answering questions.  Eather Popp, MD Medical Director [facility] Pager: [phone] 02/18/2023 2:11 PM  Stroke Neurology 02/18/2023 2:11 PM    To contact Stroke Continuity provider, please refer to [email or web address]. After hours, contact General Neurology

## 2023-02-18 NOTE — Plan of Care (Signed)
  Problem: Education: Goal: Knowledge of disease or condition will improve Outcome: Progressing Goal: Knowledge of secondary prevention will improve (MUST DOCUMENT ALL) Outcome: Progressing Goal: Knowledge of patient specific risk factors will improve Loraine Leriche N/A or DELETE if not current risk factor) Outcome: Progressing   Problem: Ischemic Stroke/TIA Tissue Perfusion: Goal: Complications of ischemic stroke/TIA will be minimized Outcome: Progressing   Problem: Coping: Goal: Will verbalize positive feelings about self Outcome: Progressing Goal: Will identify appropriate support needs Outcome: Progressing   Problem: Health Behavior/Discharge Planning: Goal: Ability to manage health-related needs will improve Outcome: Progressing Goal: Goals will be collaboratively established with patient/family Outcome: Progressing   Problem: Self-Care: Goal: Ability to participate in self-care as condition permits will improve Outcome: Progressing Goal: Verbalization of feelings and concerns over difficulty with self-care will improve Outcome: Progressing Goal: Ability to communicate needs accurately will improve Outcome: Progressing   Problem: Nutrition: Goal: Risk of aspiration will decrease Outcome: Progressing Goal: Dietary intake will improve Outcome: Progressing   Problem: Education: Goal: Knowledge of General Education information will improve Description: Including pain rating scale, medication(s)/side effects and non-pharmacologic comfort measures Outcome: Progressing   Problem: Health Behavior/Discharge Planning: Goal: Ability to manage health-related needs will improve Outcome: Progressing   Problem: Clinical Measurements: Goal: Ability to maintain clinical measurements within normal limits will improve Outcome: Progressing Goal: Will remain free from infection Outcome: Progressing Goal: Diagnostic test results will improve Outcome: Progressing Goal: Respiratory  complications will improve Outcome: Progressing Goal: Cardiovascular complication will be avoided Outcome: Progressing   Problem: Activity: Goal: Risk for activity intolerance will decrease Outcome: Progressing   Problem: Nutrition: Goal: Adequate nutrition will be maintained Outcome: Progressing   Problem: Coping: Goal: Level of anxiety will decrease Outcome: Progressing   Problem: Elimination: Goal: Will not experience complications related to bowel motility Outcome: Progressing Goal: Will not experience complications related to urinary retention Outcome: Progressing   Problem: Pain Management: Goal: General experience of comfort will improve Outcome: Progressing   Problem: Safety: Goal: Ability to remain free from injury will improve Outcome: Progressing   Problem: Skin Integrity: Goal: Risk for impaired skin integrity will decrease Outcome: Progressing   Problem: Education: Goal: Ability to describe self-care measures that may prevent or decrease complications (Diabetes Survival Skills Education) will improve Outcome: Progressing Goal: Individualized Educational Video(s) Outcome: Progressing   Problem: Coping: Goal: Ability to adjust to condition or change in health will improve Outcome: Progressing   Problem: Fluid Volume: Goal: Ability to maintain a balanced intake and output will improve Outcome: Progressing   Problem: Health Behavior/Discharge Planning: Goal: Ability to identify and utilize available resources and services will improve Outcome: Progressing Goal: Ability to manage health-related needs will improve Outcome: Progressing   Problem: Metabolic: Goal: Ability to maintain appropriate glucose levels will improve Outcome: Progressing   Problem: Nutritional: Goal: Maintenance of adequate nutrition will improve Outcome: Progressing Goal: Progress toward achieving an optimal weight will improve Outcome: Progressing   Problem: Skin  Integrity: Goal: Risk for impaired skin integrity will decrease Outcome: Progressing   Problem: Tissue Perfusion: Goal: Adequacy of tissue perfusion will improve Outcome: Progressing

## 2023-02-18 NOTE — TOC Transition Note (Signed)
 Transition of Care [facility]) - Discharge Note   Patient Details  Name: Lindsey Mercer MRN: [id number] Date of Birth: 1960/06/08  Transition of Care [facility]) CM/SW Contact:  Hendricks KANDICE Her, RN Phone Number: 02/18/2023, 4:10 PM   Clinical Narrative:     Anticipate Patient to DC to home on 1/1/ 2025.  OP PT/OT and SLP have been referred to [facility] location. A rolling walker will be delivered to bedside prior to DC ([employer]). AVS updated. Family will transport         Barriers to Discharge: Continued Medical Work up   Patient Goals and CMS Choice Patient states their goals for this hospitalization and ongoing recovery are:: Return home CMS Medicare.gov Compare Post Acute Care list provided to:: Patient Choice offered to / list presented to : Patient [place] ownership interest in [facility].provided to:: Patient    Discharge Placement                       Discharge Plan and Services Additional resources added to the After Visit Summary for   In-house Referral: Clinical Social Work Discharge Planning Services: CM Consult Post Acute Care Choice: Home Health, Durable Medical Equipment          DME Arranged: Walker rolling                    Social Drivers of Health (SDOH) Interventions SDOH Screenings   Tobacco Use: Medium Risk (02/16/2023)     Readmission Risk Interventions     No data to display

## 2023-02-18 NOTE — TOC Initial Note (Signed)
 Transition of Care [facility]) - Initial/Assessment Note    Patient Details  Name: Lindsey Mercer MRN: [id number] Date of Birth: 08/11/1960  Transition of Care [facility]) CM/SW Contact:    Inocente GORMAN Kindle, LCSW Phone Number: 02/18/2023, 4:00 PM  Clinical Narrative:                 CSW received consult for possible SNF placement at time of discharge. CSW spoke with patient and she stated she wants to go home and not to SNF. She stated she has her roommate and girlfriend to help her and her pet poodle will be very attentive to her. She is agreeable to [facility] but CSW discussed that Home Health may not accept her with cocaine use. She is agreeable to outpatient PT referral if needed. She requested a walker. RNCM assisting in obtaining.   CSW discussed cocaine use with patient and she stated she had gone to a party but is not going to do any again. She declined resources and stated she will be able to stop on her own. She did not want CSW to mention it to her roommate. She requested CSW ask MD to discharge her home tomorrow. CSW updated MD.   Skilled Nursing Rehab Facilities-   [email or web address]   Ratings out of 5 stars (5 the highest)   Name Address  Phone # Quality Care Staffing Health Inspection Overall  [facility] [address] [place] [phone] 2 2 5 5   [facility] [address] [place] [phone] 4 2 4 4   [facility] [address], [place] [phone] 2 1 2 1   [facility] [address] [place] [phone] 3 1 4 3   [facility]  [address], [place] [phone] 4 4 5 5   [facility] [address], [facility] [phone] 3 2 2 2   [facility] [address] [place] [phone] 5 1 2 2   [facility] [address] [place] [phone] 1 1 3 1   [address] ([place]) [address] [place] [phone] 2 2 2 2   [facility] [address] [place],  [place] [phone] 2 2 1 1   [facility] ([place]) [address] [place], [place] [phone] 3 1 1 1   Clotilda Pereyra [address] [place] [phone] 3 3 4 4   [facility] [address], [place] [phone] 2 2 3 3           [facility] [address] [place] [phone] 4 2 1 1   [facility] [place] [address], [place] [phone] 1 1 2 1   [facility] [address] [place] [phone] 2 1 4 3   [facility] [place] [address] [phone] 2 1 4 3   [facility] [address] [place] [phone] 2 3 3 3           [address] (no [facility]) [address], [place] [phone] 4 5 5 5   [facility] (No [employer]) [address] [phone] 1 2 4 3   [facility] [address], [place] [phone] 2 1 2 1   [facility] (No [facility]) [address], [place] [phone] 4 1 5 4   [facility] [address] [facility] [phone] 3 4 2 2   [facility] [address], [place] [phone] 2 1 1 1   [place] [address] [place] [phone] 4 2 5 5   [facility]  [address] [place] [phone] 2 2  3 [address], [place] [phone] 4 1 1 1   [facility] [address] [place], [place] [phone] 2 2 3 3           [facility] [address] [place] [phone] 2 1 1 1   [facility] [address] [place]  [phone] 3 3 3 3   [facility] (No Humana) [address] [place], [place] [phone] 2 2 4 4    [facility]) [address], [place] [phone] 2 1 1 1   [facility] [address] [place] [phone] [facility] [place] [address] [place], [place] [phone] 1 1 1 1           [facility] [address] [place], [place] [phone] 5 4 5 5   [facility])  [address] [place] [phone] 1 1 2 1    [facility]) [address] [place], [place] [phone]  2 4 4   [facility] [address] [place] [phone] 3 5 5 5   [address] [place], [place] [phone] 4 2 2 2   [facility]) [address] [place] [phone] 2 1 3 2      Expected Discharge Plan: Home w Home Health Services Barriers to Discharge: Continued Medical Work up   Patient Goals and CMS Choice Patient states their goals for this hospitalization and ongoing recovery are:: Return home CMS Medicare.gov Compare Post Acute Care list provided to:: Patient Choice offered to / list presented to : Patient [place] ownership interest in [facility].provided to:: Patient    Expected Discharge Plan and Services In-house Referral: Clinical Social Work Discharge Planning Services: CM Consult Post Acute Care Choice: Home Health, Durable Medical Equipment Living arrangements for the past 2 months: Apartment                 DME Arranged: Walker rolling                    Prior Living Arrangements/Services Living arrangements for the past 2 months: Apartment Lives with:: Roommate Patient language and need for interpreter reviewed:: Yes Do you feel safe going back to the place where you live?: Yes      Need for Family Participation in Patient Care: Yes (Comment) Care giver support system in place?: Yes (comment)   Criminal Activity/Legal Involvement Pertinent to Current Situation/Hospitalization: No - Comment as needed  Activities of Daily Living      Permission Sought/Granted Permission sought to share information with : Facility Medical Sales Representative, Family Supports Permission granted to share information with : Yes, Verbal Permission Granted     Permission granted to share info w AGENCY: HH/DME        Emotional Assessment Appearance:: Appears stated age Attitude/Demeanor/Rapport: Engaged Affect (typically observed):  Accepting, Appropriate Orientation: : Oriented to Self, Oriented to Place, Oriented to  Time, Oriented to Situation Alcohol / Substance Use: Illicit Drugs Psych Involvement: No (comment)  Admission diagnosis:  Acute CVA (cerebrovascular accident) (HCC) [I63.9] Cerebellar stroke [facility]) [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] Patient Active Problem List   Diagnosis Date Noted   Acute CVA (cerebrovascular accident) (HCC) 02/17/2023   Breast lump on right side at 1 o'clock position 08/31/2013   Breast lump on left side at 6 o'clock position 12/16/2011   CORTICAL CATARACTS 07/25/2009   TOBACCO USER 10/24/2008   Lumbago 06/29/2008   Personal history presenting hazards to health 03/03/2008   PAIN, CHRONIC NEC  12/12/2006   OBESITY 11/24/2006   HOT FLASHES 11/24/2006   Allergic rhinitis 10/07/2006   SYNDROME, CHRONIC PAIN 07/30/2006   Brachial neuritis or radiculitis 06/27/2006   HYPERCHOLESTEROLEMIA 04/17/2006   DEPRESSIVE DISORDER, NOS 04/17/2006   HYPERTENSION, BENIGN SYSTEMIC 04/17/2006   ARTHRITIS, DEGENERATIVE 04/17/2006   PCP:  Freddrick, No Pharmacy:   [id number] - [place]), [place] - [address] [place] ([place]) [place] [address] Phone: [phone] Fax: [phone]  [facility] [place], [place] - [address] [place] [address] Phone: [phone] Fax: [phone]  [facility] [place], [place] - [address] [place] [address] Phone: [phone] Fax: [phone]  [id number] [place], [place] - [address] AT [facility] [address] [place] [address] Phone: [phone] Fax: [phone]  [id number] - [place], [place] - [address] AT [facility] OF [address] [facility] [address] [place] [address] Phone: [phone] Fax: [phone]  [id number] - [address] ([address]), [place] - [address]  ([place]) [place] [address] Phone: [phone] Fax: [phone]     Social Drivers of Health (SDOH) Social History: SDOH Screenings   Tobacco Use: Medium Risk (02/16/2023)   SDOH Interventions:     Readmission Risk Interventions     No data to display

## 2023-02-18 NOTE — Progress Notes (Signed)
 OT Cancellation Note  Patient Details Name: Lindsey Mercer MRN: [id number] DOB: Jun 21, 1960   Cancelled Treatment:    Reason Eval/Treat Not Completed: Patient declined, no reason specified Pt declined to participate in therapy session reporting that she felt nauseous and still needed to eat her launch. Education provided to pt regarding the importance of participating in therapy although pt politely declined. Pt requested to wait until Thursday to work with therapy  Leita Howell, OTR/L,CBIS  Supplemental OT - [facility] Secure Chat Preferred   02/18/2023, 3:22 PM

## 2023-02-19 DIAGNOSIS — I639 Cerebral infarction, unspecified: Secondary | ICD-10-CM | POA: Diagnosis not present

## 2023-02-19 LAB — BASIC METABOLIC PANEL
Anion gap: 9 (ref 5–15)
BUN: 15 mg/dL (ref 8–23)
CO2: 22 mmol/L (ref 22–32)
Calcium: 9.4 mg/dL (ref 8.9–10.3)
Chloride: 105 mmol/L (ref 98–111)
Creatinine, Ser: 0.68 mg/dL (ref 0.44–1.00)
GFR, Estimated: >60 mL/min (ref >60)
Glucose, Bld: 101 mg/dL — ABNORMAL HIGH (ref 70–99)
Potassium: 3.7 mmol/L (ref 3.5–5.1)
Sodium: 136 mmol/L (ref 135–145)

## 2023-02-19 LAB — CBC
HCT: 43.9 % (ref 36.0–46.0)
Hemoglobin: 14.3 g/dL (ref 12.0–15.0)
MCH: 25.5 pg — ABNORMAL LOW (ref 26.0–34.0)
MCHC: 32.6 g/dL (ref 30.0–36.0)
MCV: 78.3 fL — ABNORMAL LOW (ref 80.0–100.0)
Platelets: 344 10*3/uL (ref 150–400)
RBC: 5.61 MIL/uL — ABNORMAL HIGH (ref 3.87–5.11)
RDW: 15.5 % (ref 11.5–15.5)
WBC: 8.2 10*3/uL (ref 4.0–10.5)
nRBC: 0 % (ref 0.0–0.2)

## 2023-02-19 LAB — GLUCOSE, CAPILLARY
Glucose-Capillary: 106 mg/dL — ABNORMAL HIGH (ref 70–99)
Glucose-Capillary: 110 mg/dL — ABNORMAL HIGH (ref 70–99)
Glucose-Capillary: 102 mg/dL — ABNORMAL HIGH (ref 70–99)

## 2023-02-19 MED ORDER — ADULT MULTIVITAMIN W/MINERALS CH
1.0000 | ORAL_TABLET | Freq: Every day | ORAL | Status: DC
  Administered 2023-02-19 – 2023-02-20 (×2): 1 via ORAL
  Filled 2023-02-19 (×2): qty 1

## 2023-02-19 NOTE — Plan of Care (Signed)
  Problem: Education: Goal: Knowledge of disease or condition will improve Outcome: Progressing   Problem: Ischemic Stroke/TIA Tissue Perfusion: Goal: Complications of ischemic stroke/TIA will be minimized Outcome: Progressing   Problem: Coping: Goal: Will verbalize positive feelings about self Outcome: Progressing   Problem: Health Behavior/Discharge Planning: Goal: Ability to manage health-related needs will improve Outcome: Progressing   Problem: Self-Care: Goal: Ability to participate in self-care as condition permits will improve Outcome: Progressing   Problem: Nutrition: Goal: Risk of aspiration will decrease Outcome: Progressing

## 2023-02-19 NOTE — Plan of Care (Signed)
  Problem: Education: Goal: Knowledge of disease or condition will improve Outcome: Progressing Goal: Knowledge of secondary prevention will improve (MUST DOCUMENT ALL) Outcome: Progressing Goal: Knowledge of patient specific risk factors will improve Loraine Leriche N/A or DELETE if not current risk factor) Outcome: Progressing   Problem: Ischemic Stroke/TIA Tissue Perfusion: Goal: Complications of ischemic stroke/TIA will be minimized Outcome: Progressing   Problem: Coping: Goal: Will verbalize positive feelings about self Outcome: Progressing Goal: Will identify appropriate support needs Outcome: Progressing   Problem: Health Behavior/Discharge Planning: Goal: Ability to manage health-related needs will improve Outcome: Progressing Goal: Goals will be collaboratively established with patient/family Outcome: Progressing   Problem: Self-Care: Goal: Ability to participate in self-care as condition permits will improve Outcome: Progressing Goal: Verbalization of feelings and concerns over difficulty with self-care will improve Outcome: Progressing Goal: Ability to communicate needs accurately will improve Outcome: Progressing   Problem: Nutrition: Goal: Risk of aspiration will decrease Outcome: Progressing Goal: Dietary intake will improve Outcome: Progressing   Problem: Education: Goal: Knowledge of General Education information will improve Description: Including pain rating scale, medication(s)/side effects and non-pharmacologic comfort measures Outcome: Progressing   Problem: Health Behavior/Discharge Planning: Goal: Ability to manage health-related needs will improve Outcome: Progressing   Problem: Clinical Measurements: Goal: Ability to maintain clinical measurements within normal limits will improve Outcome: Progressing Goal: Will remain free from infection Outcome: Progressing Goal: Diagnostic test results will improve Outcome: Progressing Goal: Respiratory  complications will improve Outcome: Progressing Goal: Cardiovascular complication will be avoided Outcome: Progressing   Problem: Activity: Goal: Risk for activity intolerance will decrease Outcome: Progressing   Problem: Nutrition: Goal: Adequate nutrition will be maintained Outcome: Progressing   Problem: Coping: Goal: Level of anxiety will decrease Outcome: Progressing   Problem: Elimination: Goal: Will not experience complications related to bowel motility Outcome: Progressing Goal: Will not experience complications related to urinary retention Outcome: Progressing   Problem: Pain Management: Goal: General experience of comfort will improve Outcome: Progressing   Problem: Safety: Goal: Ability to remain free from injury will improve Outcome: Progressing   Problem: Skin Integrity: Goal: Risk for impaired skin integrity will decrease Outcome: Progressing   Problem: Education: Goal: Ability to describe self-care measures that may prevent or decrease complications (Diabetes Survival Skills Education) will improve Outcome: Progressing Goal: Individualized Educational Video(s) Outcome: Progressing   Problem: Coping: Goal: Ability to adjust to condition or change in health will improve Outcome: Progressing   Problem: Fluid Volume: Goal: Ability to maintain a balanced intake and output will improve Outcome: Progressing   Problem: Health Behavior/Discharge Planning: Goal: Ability to identify and utilize available resources and services will improve Outcome: Progressing Goal: Ability to manage health-related needs will improve Outcome: Progressing   Problem: Metabolic: Goal: Ability to maintain appropriate glucose levels will improve Outcome: Progressing   Problem: Nutritional: Goal: Maintenance of adequate nutrition will improve Outcome: Progressing Goal: Progress toward achieving an optimal weight will improve Outcome: Progressing   Problem: Skin  Integrity: Goal: Risk for impaired skin integrity will decrease Outcome: Progressing   Problem: Tissue Perfusion: Goal: Adequacy of tissue perfusion will improve Outcome: Progressing

## 2023-02-19 NOTE — Progress Notes (Signed)
 PROGRESS NOTE                                                                                                                                                                                                             Patient Demographics:    Lindsey Mercer, is a 63 y.o. female, DOB - 11/09/1960, FMW:[id number]  Outpatient Primary MD for the patient is Pcp, No    LOS - 2  Admit date - 02/16/2023    Chief Complaint  Patient presents with   Headache       Brief Narrative (HPI from H&P)   63 y.o. female with a history of HTN who stopped taking medications 4 months ago and presented to the ED on 02/16/2023 for 4 days of gait instability, nausea, right-sided headache. CT head demonstrated a large acute infarct of the right inferior cerebellar hemisphere. MRI confirmed cerebellar stroke and CTA showed right PICA occlusion. UDS +cocaine. She was admitted with neurology consultation and stroke team following at [facility].    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  Continue indefinitely for now along with statin, stable A1c, LDL was above goal, full stroke workup done under the care of stroke team.  Continue PT OT, qualifies for SNF but refusing.  Likely home with PT on 02/20/2023.   HTN, uncontrolled:  - Continue norvasc  5mg , long-term goal normotensive. Avoid beta blockers due to cocaine use..   Prediabetes: HbA1c 6.3%.  - SSI for now - Would likely benefit from metformin at discharge. - RD consulted   Hyperlipidemia: LDL 160.  - Continue (new) high intensity statin.   Cocaine use:  - Cessation counseling  provided.   History of migraine:  - Avoid triptans and cocaine as above   Obesity: Body mass index is 35.77 kg/m.  Follow with PCP for weight loss.      Condition - Fair  Family Communication  : Updated significant other on 02/19/2023  Code Status : Full code  Consults  : Neurology  PUD Prophylaxis :    Procedures  :            Disposition Plan  :    Status is: Inpatient  DVT Prophylaxis  :    enoxaparin  (LOVENOX ) injection 40 mg Start: 02/17/23 1030    Lab Results  Component Value Date   PLT 344 02/19/2023    Diet :  Diet Order             Diet Heart Fluid consistency: Thin  Diet effective now                    Inpatient Medications  Scheduled Meds:  amLODipine   5 mg Oral Daily   atorvastatin   80 mg Oral Daily   clopidogrel   75 mg Oral Daily   enoxaparin  (LOVENOX ) injection  40 mg Subcutaneous Daily   insulin  aspart  0-5 Units Subcutaneous QHS   insulin  aspart  0-9 Units Subcutaneous TID WC   multivitamin with minerals  1 tablet Oral Daily   Continuous Infusions: PRN Meds:.acetaminophen , labetalol , LORazepam , prochlorperazine , traMADol   Antibiotics  :    Anti-infectives (From admission, onward)    None         Objective:   Vitals:   02/18/23 2000 02/19/23 0000 02/19/23 0330 02/19/23 0800  BP: (!) 143/81 (!) 173/85 (!) 152/82 (!) 162/93  Pulse: 68 (!) 59 76 63  Resp: 20 11 20 16   Temp: 98.2 F (36.8 C)  98.1 F (36.7 C) (!) 97.5 F (36.4 C)  TempSrc: Oral  Oral Oral  SpO2: 99% 96% 99% 97%  Weight:      Height:        Wt Readings from Last 3 Encounters:  02/16/23 97.5 kg  12/21/19 97.5 kg  03/13/17 90.7 kg     Intake/Output Summary (Last 24 hours) at 02/19/2023 1015 Last data filed at 02/18/2023 2100 Gross per 24 hour  Intake 120 ml  Output --  Net 120 ml     Physical Exam  Awake Alert, No new F.N deficits, Normal affect [place].AT,PERRAL Supple Neck, No JVD,   Symmetrical Chest wall movement, Good air movement  bilaterally, CTAB RRR,No Gallops,Rubs or new Murmurs,  +ve B.Sounds, Abd Soft, No tenderness,   No Cyanosis, Clubbing or edema        Data Review:    Recent Labs  Lab 02/16/23 1717 02/16/23 2315 02/17/23 0630 02/19/23 0427  WBC 11.1*  --  11.7* 8.2  HGB 15.6*  --  14.3 14.3  HCT 48.5*  --  43.6 43.9  PLT 372  --  329 344  MCV 79.5*  --  79.6* 78.3*  MCH 25.6*  --  26.1 25.5*  MCHC 32.2  --  32.8 32.6  RDW 16.1*  --  15.8* 15.5  LYMPHSABS  --  2.0  --   --   MONOABS  --  1.0  --   --   EOSABS  --  0.0  --   --   BASOSABS  --  0.0  --   --     Recent Labs  Lab 02/16/23 1717 02/16/23 2315 02/17/23 0630 02/19/23 0427  NA 139 136  --  136  K 3.9 4.0  --  3.7  CL 106 102  --  105  CO2 19* 23  --  22  ANIONGAP 14 11  --  9  GLUCOSE 115* 98  --  101*  BUN 21 24*  --  15  CREATININE 0.89  1.05* 0.85 0.68  AST  --  33  --   --   ALT  --  46*  --   --   ALKPHOS  --  78  --   --   BILITOT  --  1.0  --   --   ALBUMIN  --  4.1  --   --   INR  --  1.0  --   --   HGBA1C  --  6.3*  --   --   CALCIUM  9.2 9.2  --  9.4      Recent Labs  Lab 02/16/23 1717 02/16/23 2315 02/19/23 0427  INR  --  1.0  --   HGBA1C  --  6.3*  --   CALCIUM  9.2 9.2 9.4    --------------------------------------------------------------------------------------------------------------- Lab Results  Component Value Date   CHOL 235 (H) 02/17/2023   HDL 50 02/17/2023   LDLCALC 160 (H) 02/17/2023   LDLDIRECT 142 (H) 11/24/2006   TRIG 123 02/17/2023   CHOLHDL 4.7 02/17/2023    Lab Results  Component Value Date   HGBA1C 6.3 (H) 02/16/2023     Radiology Reports ECHOCARDIOGRAM COMPLETE Result Date: 02/17/2023    ECHOCARDIOGRAM REPORT   Patient Name:   Lindsey Mercer Date of Exam: 02/17/2023 Medical Rec #:  [id number]      Height:       65.0 in Accession #:    [id number]     Weight:       214.9 lb Date of Birth:  08/21/1960      BSA:          2.040 m Patient Age:    62 years       BP:            148/98 mmHg Patient Gender: F              HR:           73 bpm. Exam Location:  Inpatient Procedure: 2D Echo, Cardiac Doppler and Color Doppler Indications:    Stroke  History:        Patient has no prior history of Echocardiogram examinations.                 Risk Factors:Hypertension and Former Smoker.  Sonographer:    Ozell Free Referring Phys: [id number] ASHISH ARORA IMPRESSIONS  1. Left ventricular ejection fraction, by estimation, is 60 to 65%. The left ventricle has normal function. The left ventricle has no regional wall motion abnormalities. There is severe concentric left ventricular hypertrophy. Left ventricular diastolic  parameters are consistent with Grade I diastolic dysfunction (impaired relaxation). Elevated left ventricular end-diastolic pressure.  2. Right ventricular systolic function is normal. The right ventricular size is normal. There is normal pulmonary artery systolic pressure.  3. The mitral valve is normal in structure. No evidence of mitral valve regurgitation. No evidence of mitral stenosis.  4. The aortic valve is tricuspid. There is mild calcification of the aortic valve. Aortic valve regurgitation is not visualized. No aortic stenosis is present.  5. The inferior vena cava is normal in size with greater than 50% respiratory variability, suggesting right atrial pressure of 3 mmHg. FINDINGS  Left Ventricle: Left ventricular ejection fraction, by estimation, is 60 to 65%. The left ventricle has normal function. The left ventricle has no regional wall motion abnormalities. The left ventricular internal cavity size was normal in size. There is  severe concentric left ventricular hypertrophy. Left ventricular diastolic parameters are consistent with  Grade I diastolic dysfunction (impaired relaxation). Elevated left ventricular end-diastolic pressure. Right Ventricle: The right ventricular size is normal. No increase in right ventricular wall thickness. Right ventricular systolic function  is normal. There is normal pulmonary artery systolic pressure. The tricuspid regurgitant velocity is 1.89 m/s, and  with an assumed right atrial pressure of 3 mmHg, the estimated right ventricular systolic pressure is 17.3 mmHg. Left Atrium: Left atrial size was normal in size. Right Atrium: Right atrial size was normal in size. Pericardium: There is no evidence of pericardial effusion. Mitral Valve: The mitral valve is normal in structure. No evidence of mitral valve regurgitation. No evidence of mitral valve stenosis. Tricuspid Valve: The tricuspid valve is normal in structure. Tricuspid valve regurgitation is trivial. No evidence of tricuspid stenosis. Aortic Valve: The aortic valve is tricuspid. There is mild calcification of the aortic valve. Aortic valve regurgitation is not visualized. No aortic stenosis is present. Aortic valve mean gradient measures 4.0 mmHg. Aortic valve peak gradient measures 8.9 mmHg. Aortic valve area, by VTI measures 2.10 cm. Pulmonic Valve: The pulmonic valve was normal in structure. Pulmonic valve regurgitation is not visualized. No evidence of pulmonic stenosis. Aorta: The aortic root is normal in size and structure. Venous: The inferior vena cava is normal in size with greater than 50% respiratory variability, suggesting right atrial pressure of 3 mmHg. IAS/Shunts: No atrial level shunt detected by color flow Doppler.  LEFT VENTRICLE PLAX 2D LVIDd:         3.90 cm   Diastology LVIDs:         2.50 cm   LV e' medial:    3.26 cm/s LV PW:         1.80 cm   LV E/e' medial:  15.0 LV IVS:        1.55 cm   LV e' lateral:   4.03 cm/s LVOT diam:     2.00 cm   LV E/e' lateral: 12.1 LV SV:         39 LV SV Index:   19 LVOT Area:     3.14 cm  RIGHT VENTRICLE             IVC RV Basal diam:  3.80 cm     IVC diam: 1.30 cm RV S prime:     17.10 cm/s TAPSE (M-mode): 1.9 cm LEFT ATRIUM             Index        RIGHT ATRIUM           Index LA diam:        4.80 cm 2.35 cm/m   RA Area:     19.10 cm  LA Vol (A2C):   20.8 ml 10.20 ml/m  RA Volume:   50.20 ml  24.61 ml/m LA Vol (A4C):   43.2 ml 21.18 ml/m LA Biplane Vol: 31.9 ml 15.64 ml/m  AORTIC VALVE AV Area (Vmax):    2.45 cm AV Area (Vmean):   2.00 cm AV Area (VTI):     2.10 cm AV Vmax:           149.00 cm/s AV Vmean:          88.200 cm/s AV VTI:            0.184 m AV Peak Grad:      8.9 mmHg AV Mean Grad:      4.0 mmHg LVOT Vmax:         116.00 cm/s LVOT  Vmean:        56.200 cm/s LVOT VTI:          0.123 m LVOT/AV VTI ratio: 0.67  AORTA Ao Root diam: 3.00 cm Ao Asc diam:  3.40 cm MITRAL VALVE               TRICUSPID VALVE MV Area (PHT): 2.99 cm    TR Peak grad:   14.3 mmHg MV Decel Time: 254 msec    TR Vmax:        189.00 cm/s MV E velocity: 48.80 cm/s MV A velocity: 61.70 cm/s  SHUNTS MV E/A ratio:  0.79        Systemic VTI:  0.12 m                            Systemic Diam: 2.00 cm Annabella Scarce MD Electronically signed by Annabella Scarce MD Signature Date/Time: 02/17/2023/4:48:36 PM    Final    CT HEAD WO CONTRAST ( ) Result Date: 02/17/2023 CLINICAL DATA:  Stroke, follow-up. EXAM: CT HEAD WITHOUT CONTRAST TECHNIQUE: Contiguous axial images were obtained from the base of the skull through the vertex without intravenous contrast. RADIATION DOSE REDUCTION: This exam was performed according to the departmental dose-optimization program which includes automated exposure control, adjustment of the mA and/or kV according to patient size and/or use of iterative reconstruction technique. COMPARISON:  MRI brain 02/17/2023.  Head CT 02/16/2023. FINDINGS: Brain: Evolving large acute infarct in the medial aspect of the right cerebellar hemisphere. No acute hemorrhage. Slightly increased mass effect on the inferior fourth ventricle. No acute hydrocephalus. 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 No extra-axial collection or midline shift. Vascular: No hyperdense vessel or  unexpected calcification. Skull: No calvarial fracture or suspicious bone lesion. Skull base is unremarkable. Sinuses/Orbits: No acute findings. Other: None. IMPRESSION: Evolving large acute infarct in the medial aspect of the right cerebellar hemisphere. No acute hemorrhage. Slightly increased mass effect on the inferior fourth ventricle. No acute hydrocephalus. Electronically Signed   By: Ryan Chess M.D.   On: 02/17/2023 09:58   CT ANGIO HEAD NECK W WO CM Result Date: 02/17/2023 CLINICAL DATA:  Acute neurologic deficit EXAM: CT ANGIOGRAPHY HEAD AND NECK WITH AND WITHOUT CONTRAST TECHNIQUE: Multidetector CT imaging of the head and neck was performed using the standard protocol during bolus administration of intravenous contrast. Multiplanar CT image reconstructions and MIPs were obtained to evaluate the vascular anatomy. Carotid stenosis measurements (when applicable) are obtained utilizing NASCET criteria, using the distal internal carotid diameter as the denominator. RADIATION DOSE REDUCTION: This exam was performed according to the departmental dose-optimization program which includes automated exposure control, adjustment of the mA and/or kV according to patient size and/or use of iterative reconstruction technique. CONTRAST:  75mL OMNIPAQUE  IOHEXOL  350 MG/ML SOLN COMPARISON:  Same day brain MRI FINDINGS: CTA NECK FINDINGS Skeleton: No acute abnormality or high grade bony spinal canal stenosis. Other neck: Normal pharynx, larynx and major salivary glands. No cervical lymphadenopathy. Unremarkable thyroid gland. Upper chest: No pneumothorax or pleural effusion. No nodules or masses. Aortic arch: There is calcific atherosclerosis of the aortic arch. Normal variant aortic arch branching pattern with the brachiocephalic and left common carotid arteries sharing a common origin. RIGHT carotid system: No dissection, occlusion or aneurysm. Mild atherosclerotic calcification at the carotid bifurcation without  hemodynamically significant stenosis. LEFT carotid system: No dissection, occlusion or aneurysm. There is  mixed density atherosclerosis extending into the proximal ICA, resulting in less than 50% stenosis. Vertebral arteries: Right dominant configuration. There is no dissection, occlusion or flow-limiting stenosis to the skull base (V1-V3 segments). CTA HEAD FINDINGS POSTERIOR CIRCULATION: Vertebral arteries are normal. 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 Basilar artery is normal. Superior cerebellar arteries are normal. Posterior cerebral arteries are normal. ANTERIOR CIRCULATION: Right-greater-than-left atherosclerotic calcification with moderate stenosis of the right clinoid segment. Anterior cerebral arteries are normal. Middle cerebral arteries are normal. Venous sinuses: As permitted by contrast timing, patent. Anatomic variants: None Review of the MIP images confirms the above findings. 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 3. Bilateral carotid bifurcation atherosclerosis without hemodynamically significant stenosis by NASCET criteria. Aortic Atherosclerosis (ICD10-I70.0). Electronically Signed   By: Franky Stanford M.D.   On: 02/17/2023 02:50   MR BRAIN WO CONTRAST Result Date: 02/17/2023 CLINICAL DATA:  Nausea and vomiting EXAM: MRI HEAD WITHOUT CONTRAST TECHNIQUE: Multiplanar, multiecho pulse sequences of the brain and surrounding structures were obtained without intravenous contrast. COMPARISON:  Head CT 02/16/2023 FINDINGS: Brain: There is a large acute infarct of the inferior right cerebellar hemisphere. Small amount of acute ischemia within the medial left cerebellum. No supratentorial diffusion abnormality. Numerous chronic microhemorrhages, predominantly central. There is mass effect within the posterior fossa with narrowing of the fourth ventricle inferiorly. No hydrocephalus. There is confluent hyperintense T2-weighted signal within the white matter.  Generalized volume loss. The midline structures are normal. Vascular: Normal flow voids. Skull and upper cervical spine: Normal calvarium and skull base. Visualized upper cervical spine and soft tissues are normal. Sinuses/Orbits:No paranasal sinus fluid levels or advanced mucosal thickening. No mastoid or middle ear effusion. Normal orbits. IMPRESSION: 1. Large acute infarct of the inferior right cerebellar hemisphere with mass effect within the posterior fossa and narrowing of the fourth ventricle inferiorly. No hydrocephalus. 2. Small amount of acute ischemia within the medial left cerebellum. 3. 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   On: 02/17/2023 00:59   CT Head Wo Contrast Result Date: 02/16/2023 CLINICAL DATA:  Headache EXAM: CT HEAD WITHOUT CONTRAST TECHNIQUE: Contiguous axial images were obtained from the base of the skull through the vertex without intravenous contrast. RADIATION DOSE REDUCTION: This exam was performed according to the departmental dose-optimization program which includes automated exposure control, adjustment of the mA and/or kV according to patient size and/or use of iterative reconstruction technique. COMPARISON:  None Available. FINDINGS: Brain: There is an acute/early subacute infarct of the inferior medial right cerebellum. Chronic ischemic white matter changes. Old right caudate head small vessel infarct. Vascular: Atherosclerotic calcification of the internal carotid arteries at the skull base. No abnormal hyperdensity of the major intracranial arteries or dural venous sinuses. Skull: The visualized skull base, calvarium and extracranial soft tissues are normal. Sinuses/Orbits: No fluid levels or advanced mucosal thickening of the visualized paranasal sinuses. No mastoid or middle ear effusion. Normal orbits. Other: None. IMPRESSION: Acute/early subacute infarct of the  inferior medial right cerebellum. No hemorrhage or mass effect. Electronically Signed   By: Franky Stanford M.D.   On: 02/16/2023 23:07   DG Chest 2 View Result Date: 02/16/2023 CLINICAL DATA:  chest pain EXAM: CHEST - 2 VIEW COMPARISON:  02/26/2008. FINDINGS: The heart size and mediastinal contours are within normal limits. Both lungs are clear. No pneumothorax or pleural effusion. There are thoracic degenerative changes. IMPRESSION: No acute cardiopulmonary  disease. Electronically Signed   By: Fonda Field M.D.   On: 02/16/2023 18:25      Signature  -   Lavada Stank M.D on 02/19/2023 at 10:15 AM   -  To page go to [email or web address]

## 2023-02-19 NOTE — Progress Notes (Signed)
 Initial Nutrition Assessment  DOCUMENTATION CODES:   Obesity unspecified  INTERVENTION:  Continue to encourage healthy eating patterns Continue with heart diet Multivitamins with minerals Hearty healthy educational handouts at discharge.    NUTRITION DIAGNOSIS:   Food and nutrition related knowledge deficit related to limited prior education as evidenced by per patient/family report.    GOAL:   Patient will meet greater than or equal to 90% of their needs    MONITOR:   PO intake  REASON FOR ASSESSMENT:   Consult Diet education  ASSESSMENT: 63 y.o. F, presented to ED via EMS from home with complaints of intermittent right-side headache,nausea, vomiting for the past 4 days. Admitted with acute CVA. PMH- HTN, HLD, obesity, degenerative joint disease. MRI confirmed cerebellar stroke and CTA showed right PICA occlusion. Reached out via phone with no answer.  Unable to obtain good nutritional history with patient.  All information obtained through EMR and team.  NO appetite changes, suspect entry weight is invalid due to it matching last recorded weight. Independent feeding ability. Will provide healthy eating handouts  in discharge paper work.   Admit weight: 97.5 kg Weight history: 02/16/23 97.5 kg  12/21/19 97.5 kg  03/13/17 90.7 kg  08/09/16 83.9 kg  08/31/13 96.6 kg  12/16/11 99.2 kg  07/08/11 100.2 kg     Average Meal Intake: No current documentation  Nutritionally Relevant Medications: Scheduled Meds:  amLODipine   5 mg Oral Daily   atorvastatin   80 mg Oral Daily    Continuous Infusions:  Labs Reviewed:  HgbA1c 6.3 UDS + cocaine    NUTRITION - FOCUSED PHYSICAL EXAM:  Deferred   Diet Order:   Diet Order             Diet Heart Fluid consistency: Thin  Diet effective now                   EDUCATION NEEDS:   Education needs have been addressed  Skin:  Skin Assessment: Reviewed RN Assessment  Last BM:  PTA  Height:   Ht Readings  from Last 1 Encounters:  02/16/23 5' 5 (1.651 m)    Weight:   Wt Readings from Last 1 Encounters:  02/16/23 97.5 kg    Ideal Body Weight:     BMI:  Body mass index is 35.77 kg/m.  Estimated Nutritional Needs:   Kcal:  1700-2000 kcal  Protein:  75-85 g  Fluid:  8ml/kcal    Jenna Pew RDN, LDN Clinical Dietitian   If unable to reach, please contact RD Inpatient secure chat group between 8 am-4 pm daily

## 2023-02-20 ENCOUNTER — Other Ambulatory Visit (HOSPITAL_COMMUNITY): Payer: Self-pay

## 2023-02-20 DIAGNOSIS — I639 Cerebral infarction, unspecified: Secondary | ICD-10-CM | POA: Diagnosis not present

## 2023-02-20 LAB — GLUCOSE, CAPILLARY: Glucose-Capillary: 106 mg/dL — ABNORMAL HIGH (ref 70–99)

## 2023-02-20 MED ORDER — ACETAMINOPHEN 325 MG PO TABS: 650.0000 mg | ORAL_TABLET | Freq: Four times a day (QID) | ORAL | Status: DC | PRN

## 2023-02-20 MED ORDER — AMLODIPINE BESYLATE 10 MG PO TABS
10.0000 mg | ORAL_TABLET | Freq: Every day | ORAL | Status: DC
  Administered 2023-02-20: 10 mg via ORAL
  Filled 2023-02-20: qty 1

## 2023-02-20 MED ORDER — AMLODIPINE BESYLATE 10 MG PO TABS
10.0000 mg | ORAL_TABLET | Freq: Every day | ORAL | 2 refills | Status: DC
  Filled 2023-02-20 – 2023-03-24 (×2): qty 30, 30d supply, fill #0
  Filled 2023-05-12: qty 30, 30d supply, fill #1

## 2023-02-20 MED ORDER — TRAMADOL HCL 50 MG PO TABS
25.0000 mg | ORAL_TABLET | Freq: Once | ORAL | Status: AC
  Administered 2023-02-20: 25 mg via ORAL
  Filled 2023-02-20: qty 1

## 2023-02-20 MED ORDER — ATORVASTATIN CALCIUM 80 MG PO TABS
80.0000 mg | ORAL_TABLET | Freq: Every day | ORAL | 2 refills | Status: DC
  Filled 2023-02-20 – 2023-03-24 (×2): qty 30, 30d supply, fill #0

## 2023-02-20 MED ORDER — CLOPIDOGREL BISULFATE 75 MG PO TABS
75.0000 mg | ORAL_TABLET | Freq: Every day | ORAL | 2 refills | Status: DC
  Filled 2023-02-20 – 2023-03-24 (×2): qty 30, 30d supply, fill #0
  Filled 2023-05-11: qty 30, 30d supply, fill #1

## 2023-02-20 NOTE — Discharge Summary (Signed)
 Lindsey Mercer FMW:[id number] DOB: [birth year] DOA: 02/16/2023  PCP: Pcp, No  Admit date: 02/16/2023  Discharge date: 02/20/2023  Admitted From: Home   Disposition:  Home - refused SNF   Recommendations for Outpatient Follow-up:   Follow up with PCP in 1-2 weeks  PCP Please obtain BMP/CBC, 2 view CXR in 1week,  (see Discharge instructions)   PCP Please follow up on the following pending results:    Home Health: PT, OT if qualifies    Equipment/Devices: as below  Consultations: Neuro Discharge Condition: Stable    CODE STATUS: Full    Diet Recommendation: Heart Healthy     Chief Complaint  Patient presents with   Headache     Brief history of present illness from the day of admission and additional interim summary     63 y.o. female with a history of HTN who stopped taking medications 4 months ago and presented to the ED on 02/16/2023 for 4 days of gait instability, nausea, right-sided headache. CT head demonstrated a large acute infarct of the right inferior cerebellar hemisphere. MRI confirmed cerebellar stroke and CTA showed right PICA occlusion. UDS +cocaine. She was admitted with neurology consultation and stroke team following at [facility].                                                                  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  Continue Plavix  indefinitely for now along with statin, stable A1c, LDL was above goal, full stroke workup done under the care of stroke team.  Continue PT OT, qualifies for SNF but refusing.  Be discharged on home PT OT today has refused SNF again.   HTN, uncontrolled:   -Heart rate on norvasc  10mg , long-term goal normotensive. Avoid beta blockers due to cocaine use.SABRA  PCP to monitor and adjust.   Prediabetes: HbA1c 6.3%.  -Outpatient follow-up with PCP   Hyperlipidemia: LDL 160.  - Continue (new) high intensity statin.   Cocaine use: - Cessation counseling provided.   History of migraine: - Avoid triptans and cocaine as above   Obesity: Body mass index is 35.77 kg/m.  Follow with PCP for weight loss.    Discharge diagnosis     Principal Problem:   Acute CVA (cerebrovascular accident) [facility])    Discharge instructions    Discharge Instructions     Diet - low sodium heart healthy   Complete by: As directed    Discharge instructions   Complete by: As directed    Follow with Primary MD Pcp, No in 7 days   Get CBC, CMP, 2 view Chest X ray -  checked next visit with your primary MD    Activity: As tolerated with Full fall precautions use walker/cane & assistance as needed  Disposition Home    Diet: Heart Healthy   Special Instructions: If you have smoked or chewed Tobacco  in the last 2 yrs please stop smoking, stop any regular Alcohol  and or any Recreational drug use.  On your next visit with your primary care physician please Get Medicines reviewed and adjusted.  Please request your Prim.MD to go over all Hospital Tests and Procedure/Radiological results at the follow up, please get all Hospital records sent to your Prim MD by signing hospital release before you go home.  If you experience worsening of your admission symptoms, develop shortness of breath, life threatening emergency, suicidal or homicidal thoughts you must seek medical attention immediately by calling 911 or calling your MD immediately  if symptoms less severe.  You Must read complete instructions/literature along with all the possible adverse reactions/side effects for all the Medicines you take and that have been prescribed to you. Take any new Medicines after you have  completely understood and accpet all the possible adverse reactions/side effects.   Increase activity slowly   Complete by: As directed        Discharge Medications   Allergies as of 02/20/2023       Reactions   Aspirin Hives, Nausea Only   Lisinopril    REACTION: Dry cough   Penicillins    REACTION: hives   Sulfa Antibiotics Hives        Medication List     STOP taking these medications    ibuprofen 800 MG tablet Commonly known as: ADVIL       TAKE these medications    amLODipine  10 MG tablet Commonly known as: NORVASC  Take 1 tablet (10 mg total) by mouth daily.   atorvastatin  80 MG tablet Commonly known as: LIPITOR  Take 1 tablet (80 mg total) by mouth daily.   clopidogrel  75 MG tablet Commonly known as: PLAVIX  Take 1 tablet (75 mg total) by mouth daily.         Follow-up Information     [facility] Follow up.   Contact information: [address]  [address]  [place], [place] [address]  [phone]        [facility]. Schedule an appointment as soon as possible for a visit in 1 week(s).   Why: Or your PCP within a week Contact information: [address] [place]  [address] [phone]        [facility] Follow up in 2 week(s).   Specialty: Neurology Contact information: [address] [place]  [address] [phone]                Major procedures and Radiology Reports - PLEASE review detailed and final reports thoroughly  -       ECHOCARDIOGRAM COMPLETE Result Date: 02/17/2023    ECHOCARDIOGRAM REPORT   Patient Name:   Lindsey Mercer Date of Exam: 02/17/2023 Medical Rec #:  [id number]      Height:       65.0 in Accession #:    [id number]     Weight:       214.9 lb Date of Birth:  September 24, 1960      BSA:          2.040 m Patient Age:    63 years  BP:           148/98 mmHg Patient Gender: F              HR:            73 bpm. Exam Location:  Inpatient Procedure: 2D Echo, Cardiac Doppler and Color Doppler Indications:    Stroke  History:        Patient has no prior history of Echocardiogram examinations.                 Risk Factors:Hypertension and Former Smoker.  Sonographer:    Ozell Free Referring Phys: [id number] ASHISH ARORA IMPRESSIONS  1. Left ventricular ejection fraction, by estimation, is 60 to 65%. The left ventricle has normal function. The left ventricle has no regional wall motion abnormalities. There is severe concentric left ventricular hypertrophy. Left ventricular diastolic  parameters are consistent with Grade I diastolic dysfunction (impaired relaxation). Elevated left ventricular end-diastolic pressure.  2. Right ventricular systolic function is normal. The right ventricular size is normal. There is normal pulmonary artery systolic pressure.  3. The mitral valve is normal in structure. No evidence of mitral valve regurgitation. No evidence of mitral stenosis.  4. The aortic valve is tricuspid. There is mild calcification of the aortic valve. Aortic valve regurgitation is not visualized. No aortic stenosis is present.  5. The inferior vena cava is normal in size with greater than 50% respiratory variability, suggesting right atrial pressure of 3 mmHg. FINDINGS  Left Ventricle: Left ventricular ejection fraction, by estimation, is 60 to 65%. The left ventricle has normal function. The left ventricle has no regional wall motion abnormalities. The left ventricular internal cavity size was normal in size. There is  severe concentric left ventricular hypertrophy. Left ventricular diastolic parameters are consistent with Grade I diastolic dysfunction (impaired relaxation). Elevated left ventricular end-diastolic pressure. Right Ventricle: The right ventricular size is normal. No increase in right ventricular wall thickness. Right ventricular systolic function is normal. There is normal pulmonary artery systolic  pressure. The tricuspid regurgitant velocity is 1.89 m/s, and  with an assumed right atrial pressure of 3 mmHg, the estimated right ventricular systolic pressure is 17.3 mmHg. Left Atrium: Left atrial size was normal in size. Right Atrium: Right atrial size was normal in size. Pericardium: There is no evidence of pericardial effusion. Mitral Valve: The mitral valve is normal in structure. No evidence of mitral valve regurgitation. No evidence of mitral valve stenosis. Tricuspid Valve: The tricuspid valve is normal in structure. Tricuspid valve regurgitation is trivial. No evidence of tricuspid stenosis. Aortic Valve: The aortic valve is tricuspid. There is mild calcification of the aortic valve. Aortic valve regurgitation is not visualized. No aortic stenosis is present. Aortic valve mean gradient measures 4.0 mmHg. Aortic valve peak gradient measures 8.9 mmHg. Aortic valve area, by VTI measures 2.10 cm. Pulmonic Valve: The pulmonic valve was normal in structure. Pulmonic valve regurgitation is not visualized. No evidence of pulmonic stenosis. Aorta: The aortic root is normal in size and structure. Venous: The inferior vena cava is normal in size with greater than 50% respiratory variability, suggesting right atrial pressure of 3 mmHg. IAS/Shunts: No atrial level shunt detected by color flow Doppler.  LEFT VENTRICLE PLAX 2D LVIDd:         3.90 cm   Diastology LVIDs:         2.50 cm   LV e' medial:    3.26 cm/s LV PW:  1.80 cm   LV E/e' medial:  15.0 LV IVS:        1.55 cm   LV e' lateral:   4.03 cm/s LVOT diam:     2.00 cm   LV E/e' lateral: 12.1 LV SV:         39 LV SV Index:   19 LVOT Area:     3.14 cm  RIGHT VENTRICLE             IVC RV Basal diam:  3.80 cm     IVC diam: 1.30 cm RV S prime:     17.10 cm/s TAPSE (M-mode): 1.9 cm LEFT ATRIUM             Index        RIGHT ATRIUM           Index LA diam:        4.80 cm 2.35 cm/m   RA Area:     19.10 cm LA Vol (A2C):   20.8 ml 10.20 ml/m  RA Volume:    50.20 ml  24.61 ml/m LA Vol (A4C):   43.2 ml 21.18 ml/m LA Biplane Vol: 31.9 ml 15.64 ml/m  AORTIC VALVE AV Area (Vmax):    2.45 cm AV Area (Vmean):   2.00 cm AV Area (VTI):     2.10 cm AV Vmax:           149.00 cm/s AV Vmean:          88.200 cm/s AV VTI:            0.184 m AV Peak Grad:      8.9 mmHg AV Mean Grad:      4.0 mmHg LVOT Vmax:         116.00 cm/s LVOT Vmean:        56.200 cm/s LVOT VTI:          0.123 m LVOT/AV VTI ratio: 0.67  AORTA Ao Root diam: 3.00 cm Ao Asc diam:  3.40 cm MITRAL VALVE               TRICUSPID VALVE MV Area (PHT): 2.99 cm    TR Peak grad:   14.3 mmHg MV Decel Time: 254 msec    TR Vmax:        189.00 cm/s MV E velocity: 48.80 cm/s MV A velocity: 61.70 cm/s  SHUNTS MV E/A ratio:  0.79        Systemic VTI:  0.12 m                            Systemic Diam: 2.00 cm Annabella Scarce MD Electronically signed by Annabella Scarce MD Signature Date/Time: 02/17/2023/4:48:36 PM    Final    CT HEAD WO CONTRAST ( ) Result Date: 02/17/2023 CLINICAL DATA:  Stroke, follow-up. EXAM: CT HEAD WITHOUT CONTRAST TECHNIQUE: Contiguous axial images were obtained from the base of the skull through the vertex without intravenous contrast. RADIATION DOSE REDUCTION: This exam was performed according to the departmental dose-optimization program which includes automated exposure control, adjustment of the mA and/or kV according to patient size and/or use of iterative reconstruction technique. COMPARISON:  MRI brain 02/17/2023.  Head CT 02/16/2023. FINDINGS: Brain: Evolving large acute infarct in the medial aspect of the right cerebellar hemisphere. No acute hemorrhage. Slightly increased mass effect on the inferior fourth ventricle. No acute hydrocephalus. 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 No extra-axial collection or midline shift. Vascular: No hyperdense vessel or unexpected calcification. Skull: No calvarial fracture  or suspicious bone lesion. Skull base is unremarkable. Sinuses/Orbits: No acute findings. Other: None. IMPRESSION: Evolving large acute infarct in the medial aspect of the right cerebellar hemisphere. No acute hemorrhage. Slightly increased mass effect on the inferior fourth ventricle. No acute hydrocephalus. Electronically Signed   By: Ryan Chess M.D.   On: 02/17/2023 09:58   CT ANGIO HEAD NECK W WO CM Result Date: 02/17/2023 CLINICAL DATA:  Acute neurologic deficit EXAM: CT ANGIOGRAPHY HEAD AND NECK WITH AND WITHOUT CONTRAST TECHNIQUE: Multidetector CT imaging of the head and neck was performed using the standard protocol during bolus administration of intravenous contrast. Multiplanar CT image reconstructions and MIPs were obtained to evaluate the vascular anatomy. Carotid stenosis measurements (when applicable) are obtained utilizing NASCET criteria, using the distal internal carotid diameter as the denominator. RADIATION DOSE REDUCTION: This exam was performed according to the departmental dose-optimization program which includes automated exposure control, adjustment of the mA and/or kV according to patient size and/or use of iterative reconstruction technique. CONTRAST:  75mL OMNIPAQUE  IOHEXOL  350 MG/ML SOLN COMPARISON:  Same day brain MRI FINDINGS: CTA NECK FINDINGS Skeleton: No acute abnormality or high grade bony spinal canal stenosis. Other neck: Normal pharynx, larynx and major salivary glands. No cervical lymphadenopathy. Unremarkable thyroid gland. Upper chest: No pneumothorax or pleural effusion. No nodules or masses. Aortic arch: There is calcific atherosclerosis of the aortic arch. Normal variant aortic arch branching pattern with the brachiocephalic and left common carotid arteries sharing a common origin. RIGHT carotid system: No dissection, occlusion or aneurysm. Mild atherosclerotic calcification at the carotid bifurcation without hemodynamically significant stenosis. LEFT carotid  system: No dissection, occlusion or aneurysm. There is mixed density atherosclerosis extending into the proximal ICA, resulting in less than 50% stenosis. Vertebral arteries: Right dominant configuration. There is no dissection, occlusion or flow-limiting stenosis to the skull base (V1-V3 segments). CTA HEAD FINDINGS POSTERIOR CIRCULATION: Vertebral arteries are normal. 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 Basilar artery is normal. Superior cerebellar arteries are normal. Posterior cerebral arteries are normal. ANTERIOR CIRCULATION: Right-greater-than-left atherosclerotic calcification with moderate stenosis of the right clinoid segment. Anterior cerebral arteries are normal. Middle cerebral arteries are normal. Venous sinuses: As permitted by contrast timing, patent. Anatomic variants: None Review of the MIP images confirms the above findings. 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 3. Bilateral carotid bifurcation atherosclerosis without hemodynamically significant stenosis by NASCET criteria. Aortic Atherosclerosis (ICD10-I70.0). Electronically Signed   By: Franky Stanford M.D.   On: 02/17/2023 02:50   MR BRAIN WO CONTRAST Result Date: 02/17/2023 CLINICAL DATA:  Nausea and vomiting EXAM: MRI HEAD WITHOUT CONTRAST TECHNIQUE: Multiplanar, multiecho pulse sequences of the brain and surrounding structures were obtained without intravenous contrast. COMPARISON:  Head CT 02/16/2023 FINDINGS: Brain: There is a large acute infarct of the inferior right cerebellar hemisphere. Small amount of acute ischemia within the medial left cerebellum. No supratentorial diffusion abnormality. Numerous chronic microhemorrhages, predominantly central. There is mass effect within the posterior fossa with narrowing of the fourth ventricle inferiorly. No hydrocephalus. There is confluent hyperintense T2-weighted signal within the white matter. Generalized volume loss. The midline structures are  normal. Vascular: Normal flow voids. Skull and upper cervical spine: Normal calvarium and skull base. Visualized upper cervical spine and soft tissues are normal. Sinuses/Orbits:No paranasal sinus fluid levels or advanced mucosal thickening. No mastoid or  middle ear effusion. Normal orbits. IMPRESSION: 1. Large acute infarct of the inferior right cerebellar hemisphere with mass effect within the posterior fossa and narrowing of the fourth ventricle inferiorly. No hydrocephalus. 2. Small amount of acute ischemia within the medial left cerebellum. 3. 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   On: 02/17/2023 00:59   CT Head Wo Contrast Result Date: 02/16/2023 CLINICAL DATA:  Headache EXAM: CT HEAD WITHOUT CONTRAST TECHNIQUE: Contiguous axial images were obtained from the base of the skull through the vertex without intravenous contrast. RADIATION DOSE REDUCTION: This exam was performed according to the departmental dose-optimization program which includes automated exposure control, adjustment of the mA and/or kV according to patient size and/or use of iterative reconstruction technique. COMPARISON:  None Available. FINDINGS: Brain: There is an acute/early subacute infarct of the inferior medial right cerebellum. Chronic ischemic white matter changes. Old right caudate head small vessel infarct. Vascular: Atherosclerotic calcification of the internal carotid arteries at the skull base. No abnormal hyperdensity of the major intracranial arteries or dural venous sinuses. Skull: The visualized skull base, calvarium and extracranial soft tissues are normal. Sinuses/Orbits: No fluid levels or advanced mucosal thickening of the visualized paranasal sinuses. No mastoid or middle ear effusion. Normal orbits. Other: None. 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 No hemorrhage or  mass effect. Electronically Signed   By: Franky Stanford M.D.   On: 02/16/2023 23:07   DG Chest 2 View Result Date: 02/16/2023 CLINICAL DATA:  chest pain EXAM: CHEST - 2 VIEW COMPARISON:  02/26/2008. FINDINGS: The heart size and mediastinal contours are within normal limits. Both lungs are clear. No pneumothorax or pleural effusion. There are thoracic degenerative changes. IMPRESSION: No acute cardiopulmonary disease. Electronically Signed   By: Fonda Field M.D.   On: 02/16/2023 18:25    Micro Results     No results found for this or any previous visit (from the past 240 hours).  Today   Subjective    Lindsey Mercer today has no headache,no chest abdominal pain,no new weakness tingling or numbness, feels much better wants to go home today.     Objective   Blood pressure (!) 158/80, pulse 69, temperature 97.8 F (36.6 C), temperature source Oral, resp. rate 15, height 5' 5 (1.651 m), weight 97.5 kg, SpO2 98%.  No intake or output data in the 24 hours ending 02/20/23 0852  Exam  Awake Alert, No new F.N deficits,    .AT,PERRAL Supple Neck,   Symmetrical Chest wall movement, Good air movement bilaterally, CTAB RRR,No Gallops,   +ve B.Sounds, Abd Soft, Non tender,  No Cyanosis, Clubbing or edema    Data Review   Recent Labs  Lab 02/16/23 1717 02/16/23 2315 02/17/23 0630 02/19/23 0427  WBC 11.1*  --  11.7* 8.2  HGB 15.6*  --  14.3 14.3  HCT 48.5*  --  43.6 43.9  PLT 372  --  329 344  MCV 79.5*  --  79.6* 78.3*  MCH 25.6*  --  26.1 25.5*  MCHC 32.2  --  32.8 32.6  RDW 16.1*  --  15.8* 15.5  LYMPHSABS  --  2.0  --   --   MONOABS  --  1.0  --   --   EOSABS  --  0.0  --   --   BASOSABS  --  0.0  --   --     Recent Labs  Lab 02/16/23 1717 02/16/23 2315 02/17/23 0630 02/19/23 0427  NA 139 136  --  136  K 3.9 4.0  --  3.7  CL 106 102  --  105  CO2 19* 23  --  22  ANIONGAP 14 11  --  9  GLUCOSE 115* 98  --  101*  BUN 21 24*  --  15  CREATININE 0.89 1.05*  0.85 0.68  AST  --  33  --   --   ALT  --  46*  --   --   ALKPHOS  --  78  --   --   BILITOT  --  1.0  --   --   ALBUMIN  --  4.1  --   --   INR  --  1.0  --   --   HGBA1C  --  6.3*  --   --   CALCIUM  9.2 9.2  --  9.4    Total Time in preparing paper work, data evaluation and todays exam - 35 minutes  Signature  -    Lavada Stank M.D on 02/20/2023 at 8:52 AM   -  To page go to [email or web address]

## 2023-02-20 NOTE — Discharge Instructions (Signed)
 Follow with Primary MD Pcp, No in 7 days   Get CBC, CMP, 2 view Chest X ray -  checked next visit with your primary MD    Activity: As tolerated with Full fall precautions use walker/cane & assistance as needed  Disposition Home    Diet: Heart Healthy   Special Instructions: If you have smoked or chewed Tobacco  in the last 2 yrs please stop smoking, stop any regular Alcohol  and or any Recreational drug use.  On your next visit with your primary care physician please Get Medicines reviewed and adjusted.  Please request your Prim.MD to go over all Hospital Tests and Procedure/Radiological results at the follow up, please get all Hospital records sent to your Prim MD by signing hospital release before you go home.  If you experience worsening of your admission symptoms, develop shortness of breath, life threatening emergency, suicidal or homicidal thoughts you must seek medical attention immediately by calling 911 or calling your MD immediately  if symptoms less severe.  You Must read complete instructions/literature along with all the possible adverse reactions/side effects for all the Medicines you take and that have been prescribed to you. Take any new Medicines after you have completely understood and accpet all the possible adverse reactions/side effects.

## 2023-02-20 NOTE — Progress Notes (Addendum)
 Patient is alert, awake, BP 190/ 92 hr 66 @ 0011. Complained headache . PT was given 10 mg IV Labetalol  , 1 tablet tramadol .  rechecked BP @ 0030 150/ 82. Hr 63. @ 0046 BP 164/ 98 , hr 59. Pt is resting , relax, relieved headache a short time and still remained headache . MD notified . PT was given 0.5 tablet  Tramadol  one dose. Patient has nausea , was given 5 mg IV Compazine .

## 2023-02-24 ENCOUNTER — Telehealth (INDEPENDENT_AMBULATORY_CARE_PROVIDER_SITE_OTHER): Payer: Self-pay

## 2023-02-24 ENCOUNTER — Ambulatory Visit: Payer: Self-pay

## 2023-02-24 ENCOUNTER — Inpatient Hospital Stay (INDEPENDENT_AMBULATORY_CARE_PROVIDER_SITE_OTHER): Payer: 59 | Admitting: Primary Care

## 2023-02-24 NOTE — Telephone Encounter (Signed)
 Copied from CRM [id number]. Topic: General - Other >> Feb 24, 2023  3:40 PM Nathanel BROCKS wrote: Reason for CRM: pt called and was wanting medication prior to her being an est patient. She had spoken to a nurse and flow coordinator regarding this matter before I got her. I am documenting as an additional call.

## 2023-02-24 NOTE — Telephone Encounter (Signed)
 Summary: Medication request   Pt is calling in because she has a hospital follow up scheduled for 03/11/23 and wanted to know if Lindsey Mercer could prescribe her Hydrochlorothiazide for her blood pressure and 800 mg ibuprofen. Pt says the bp meds she was prescribed at the hospital make her sick and she wants another medication.       Voice mailbox is full, unable to leave message.

## 2023-02-24 NOTE — Telephone Encounter (Signed)
 Received a call from pec from [place] in regards to this pt. Per nathanel pt stated that she is talking suing us  if she dies. Made Nathanel aware that I just spoke with and they hung on me. Made Nathanel aware that she can transfer call and I will try to explain what I was trying to explain before I was hung up on. Nathanel transferred call and I informed pt that I was the nurse that was just speaking with her and per pt she is needing a different medication. Tried to explain to pt that the provider that sent in her originals prescriptions were from the doctor at the hospital and because [facility] has not physically seen her as a patient she is unable to give medication or medical advice.  Pt was on speaker phone and Broderick heard the conversation. Pt did become rude and hung up

## 2023-02-24 NOTE — Telephone Encounter (Signed)
 Spoke with pt twice about this. Tried to make pt aware of provider previous response...  Provider will not be able to give medication management or medical advice. Per provider pt will have to wait to be seen in order for medication to be changed. If pt is wanting it done sooner pt can be seen at the mobile unit, ED or Urgent Care.,

## 2023-02-24 NOTE — Telephone Encounter (Signed)
  Chief Complaint: Medication issue Symptoms: Stomach hurts after taking amlodipine  Frequency: since starting amlodipine  Pertinent Negatives: Patient denies  Disposition: [] ED /[] Urgent Care (no appt availability in office) / [] Appointment(In office/virtual)/ []  [facility]/ [] Home Care/ [] Refused Recommended Disposition /[] [place] [facility]/ [x]  Follow-up with PCP Additional Notes: Pt was recently discharged from hospital. Pt was prescribed amlodipine  for HTN. Pt states that the medication causes her stomach to hurt. Pt would like hydrochlorothiazide prescribed. PT has taken this in the past and it does not hurt her stomach. Additionally pt would like IUB 800 mg prescribed for pain. Please advise.  Summary: Medication request   Pt is calling in because she has a hospital follow up scheduled for 03/11/23 and wanted to know if Rosaline could prescribe her Hydrochlorothiazide for her blood pressure and 800 mg ibuprofen. Pt says the bp meds she was prescribed at the hospital make her sick and she wants another medication.     Reason for Disposition  Prescription request for new medicine (not a refill)  Answer Assessment - Initial Assessment Questions 1. NAME of MEDICINE: What medicine(s) are you calling about?     Amlodipine  2. QUESTION: What is your question? (e.g., double dose of medicine, side effect)     Medication is making her stomach hurt 3. PRESCRIBER: Who prescribed the medicine? Reason: if prescribed by specialist, call should be referred to that group.     Hospital 4. SYMPTOMS: Do you have any symptoms? If Yes, ask: What symptoms are you having?  How bad are the symptoms (e.g., mild, moderate, severe)     Stomach hurts  Protocols used: Medication Question Call-A-AH

## 2023-02-24 NOTE — Telephone Encounter (Signed)
 Pt has not establish care as of yet. Spoke with provider and per provider pt can be seen at the urgent care or the ed for medication management  until she is seen by the provider. Per provider not able to give medication advice.   Reached out to pt to go over provider response pt stated provider just seen in her some medication and I made pt aware that it was the doctor at the hospital that sent in the medication then some lady in the background stated they just trying to scam you and hung up

## 2023-03-11 ENCOUNTER — Inpatient Hospital Stay (INDEPENDENT_AMBULATORY_CARE_PROVIDER_SITE_OTHER): Payer: 59 | Admitting: Primary Care

## 2023-03-24 ENCOUNTER — Other Ambulatory Visit (HOSPITAL_COMMUNITY): Payer: Self-pay

## 2023-03-25 ENCOUNTER — Other Ambulatory Visit (HOSPITAL_COMMUNITY): Payer: Self-pay

## 2023-03-25 ENCOUNTER — Telehealth (INDEPENDENT_AMBULATORY_CARE_PROVIDER_SITE_OTHER): Payer: Self-pay

## 2023-03-25 ENCOUNTER — Telehealth (INDEPENDENT_AMBULATORY_CARE_PROVIDER_SITE_OTHER): Payer: Self-pay | Admitting: Primary Care

## 2023-03-25 NOTE — Telephone Encounter (Signed)
In response to CRM, I called pt to schedule HFU. Pt did not answer and mailbox was full. Please advise

## 2023-03-25 NOTE — Telephone Encounter (Signed)
E2c2 called with pt on the line. Pt was returning my phone call about scheduling atp. Pt was able to get atp scheduled

## 2023-03-25 NOTE — Telephone Encounter (Signed)
 Copied from CRM [id number]. Topic: Appointments - Appointment Scheduling >> Mar 25, 2023  8:33 AM Montie POUR wrote: Patient/patient representative is calling to schedule an appointment. Refer to attachments for appointment information.  Uyen is called to make an appointment for hospital follow up visit. On 03/19/23 was admitted to hospital for a stroke; She was at [facility]. No appointment available until April 9th. Please call her at [phone] to schedule the appointment >> Mar 25, 2023  8:40 AM Cynthia K wrote: She can also receive a message through MyChart. Thanks

## 2023-04-07 ENCOUNTER — Inpatient Hospital Stay (INDEPENDENT_AMBULATORY_CARE_PROVIDER_SITE_OTHER): Payer: Self-pay | Admitting: Primary Care

## 2023-05-12 ENCOUNTER — Other Ambulatory Visit (HOSPITAL_COMMUNITY): Payer: Self-pay

## 2023-05-15 ENCOUNTER — Other Ambulatory Visit (HOSPITAL_COMMUNITY): Payer: Self-pay

## 2023-06-16 ENCOUNTER — Other Ambulatory Visit: Payer: Self-pay

## 2023-06-16 ENCOUNTER — Other Ambulatory Visit (HOSPITAL_COMMUNITY): Payer: Self-pay

## 2023-06-18 ENCOUNTER — Other Ambulatory Visit (HOSPITAL_COMMUNITY): Payer: Self-pay

## 2023-06-23 ENCOUNTER — Other Ambulatory Visit (INDEPENDENT_AMBULATORY_CARE_PROVIDER_SITE_OTHER): Payer: Self-pay | Admitting: Primary Care

## 2023-06-23 ENCOUNTER — Other Ambulatory Visit (HOSPITAL_COMMUNITY): Payer: Self-pay

## 2023-07-02 ENCOUNTER — Telehealth (INDEPENDENT_AMBULATORY_CARE_PROVIDER_SITE_OTHER): Payer: Self-pay | Admitting: Primary Care

## 2023-07-02 NOTE — Telephone Encounter (Signed)
 Called pt to confirm appt. LVM with pt.

## 2023-07-03 ENCOUNTER — Ambulatory Visit (INDEPENDENT_AMBULATORY_CARE_PROVIDER_SITE_OTHER): Payer: Self-pay | Admitting: Primary Care

## 2023-07-03 ENCOUNTER — Other Ambulatory Visit (HOSPITAL_COMMUNITY): Payer: Self-pay

## 2023-07-03 ENCOUNTER — Encounter (INDEPENDENT_AMBULATORY_CARE_PROVIDER_SITE_OTHER): Payer: Self-pay | Admitting: Primary Care

## 2023-07-03 VITALS — BP 136/83 | HR 74 | Resp 16 | Ht 65.0 in | Wt 188.2 lb

## 2023-07-03 DIAGNOSIS — Z5912 Inadequate housing utilities: Secondary | ICD-10-CM

## 2023-07-03 DIAGNOSIS — Z5941 Food insecurity: Secondary | ICD-10-CM

## 2023-07-03 DIAGNOSIS — F17200 Nicotine dependence, unspecified, uncomplicated: Secondary | ICD-10-CM

## 2023-07-03 DIAGNOSIS — E7800 Pure hypercholesterolemia, unspecified: Secondary | ICD-10-CM

## 2023-07-03 DIAGNOSIS — Z9889 Other specified postprocedural states: Secondary | ICD-10-CM

## 2023-07-03 DIAGNOSIS — I639 Cerebral infarction, unspecified: Secondary | ICD-10-CM

## 2023-07-03 DIAGNOSIS — I10 Essential (primary) hypertension: Secondary | ICD-10-CM

## 2023-07-03 DIAGNOSIS — Z2821 Immunization not carried out because of patient refusal: Secondary | ICD-10-CM

## 2023-07-03 DIAGNOSIS — Z1159 Encounter for screening for other viral diseases: Secondary | ICD-10-CM

## 2023-07-03 MED ORDER — ATORVASTATIN CALCIUM 80 MG PO TABS
80.0000 mg | ORAL_TABLET | Freq: Every day | ORAL | 1 refills | Status: DC
  Filled 2023-07-03: qty 90, 90d supply, fill #0

## 2023-07-03 MED ORDER — AMLODIPINE BESYLATE 10 MG PO TABS
10.0000 mg | ORAL_TABLET | Freq: Every day | ORAL | 2 refills | Status: DC
  Filled 2023-07-03: qty 90, 90d supply, fill #0

## 2023-07-03 MED ORDER — ATORVASTATIN CALCIUM 80 MG PO TABS
80.0000 mg | ORAL_TABLET | Freq: Every day | ORAL | 2 refills | Status: DC
  Filled 2023-07-03: qty 90, 90d supply, fill #0

## 2023-07-03 MED ORDER — CLOPIDOGREL BISULFATE 75 MG PO TABS
75.0000 mg | ORAL_TABLET | Freq: Every day | ORAL | 1 refills | Status: DC
  Filled 2023-07-03: qty 90, 90d supply, fill #0

## 2023-07-03 MED ORDER — CLOPIDOGREL BISULFATE 75 MG PO TABS
75.0000 mg | ORAL_TABLET | Freq: Every day | ORAL | 2 refills | Status: DC
  Filled 2023-07-03: qty 90, 90d supply, fill #0

## 2023-07-03 MED ORDER — AMLODIPINE BESYLATE 10 MG PO TABS
10.0000 mg | ORAL_TABLET | Freq: Every day | ORAL | 1 refills | Status: DC
  Filled 2023-07-03: qty 90, 90d supply, fill #0

## 2023-07-03 NOTE — Progress Notes (Signed)
 New Patient Office Visit  Subjective    Patient ID: Lindsey Mercer female  DOB: 09/25/1960  Age: 63 y.o. MRN: [id number]   CC:  Establish care HPI     Hypertension    Additional comments: Out of medications       Last edited by Kathryne Parisian, RMA on 07/03/2023 10:29 AM.      HPI  Lindsey Mercer is a 63 year old female who presents to establish care.  She recently had a CVA in December was instructed to have a appointment in a week 02/26/2023 to [facility] care at [facility] also follow-up with different neurologist [facility].  She has not seen neurology we will place a referral and check labs refill medications. Patient has No headache, No chest pain, No abdominal pain - No Nausea, No new weakness tingling or numbness, No Cough - shortness of breath/s.  Patient has been out of blood pressure medication for the last 3 days but was very careful with what she was eating  No current outpatient medications on file prior to visit.   No current facility-administered medications on file prior to visit.     Allergies  Allergen Reactions   Aspirin Hives and Nausea Only   Lisinopril     REACTION: Dry cough   Penicillins     REACTION: hives   Sulfa Antibiotics Hives    Past Medical History:  Diagnosis Date   Allergy    Arthritis    DJD (degenerative joint disease)    Hypertension      Past Surgical History:  Procedure Laterality Date   ABDOMINAL HYSTERECTOMY     1993     Family History  Problem Relation Age of Onset   Arthritis Mother    Breast cancer Mother    Hypertension Mother    Cancer Paternal Aunt        breast   Arthritis Father    Hypertension Father    Arthritis Other    Breast cancer Sister    Hypertension Sister     Social History   Socioeconomic History   Marital status: Widowed    Spouse name: Not on file   Number of children: Not on file   Years of education: Not on file   Highest education level: Not on file   Occupational History   Not on file  Tobacco Use   Smoking status: Former   Smokeless tobacco: Never  Vaping Use   Vaping status: Never Used  Substance and Sexual Activity   Alcohol use: Yes    Comment: rarely   Drug use: No   Sexual activity: Not Currently    Birth control/protection: Surgical  Other Topics Concern   Not on file  Social History Narrative   Not on file   Social Drivers of Health   Financial Resource Strain: Not on file  Food Insecurity: Food Insecurity Present (07/03/2023)   Hunger Vital Sign    Worried About Running Out of Food in the Last Year: Never true    Ran Out of Food in the Last Year: Sometimes true  Transportation Needs: No Transportation Needs (07/03/2023)   PRAPARE - Administrator, Civil Service (Medical): No    Lack of Transportation (Non-Medical): No  Physical Activity: Not on file  Stress: Not on file  Social Connections: Not on file  Intimate Partner Violence: Not At Risk (07/03/2023)   Humiliation, Afraid, Rape, and Kick questionnaire    Fear of Current or Ex-Partner:  No    Emotionally Abused: No    Physically Abused: No    Sexually Abused: No    SDOH Interventions Today    Flowsheet Row Most Recent Value  SDOH Interventions   Food Insecurity Interventions AMB Referral  Housing Interventions Intervention Not Indicated  Transportation Interventions Intervention Not Indicated  Utilities Interventions AMB Referral        Health Maintenance  Topic Date Due   Hepatitis C Screening  Never done   DTaP/Tdap/Td vaccine (1 - Tdap) Never done   Pap with HPV screening  Never done   Colon Cancer Screening  Never done   Mammogram  09/17/2010   COVID-19 Vaccine (2 - 2024-25 season) 10/20/2022   Zoster (Shingles) Vaccine (1 of 2) 10/03/2023*   Flu Shot  09/19/2023   HIV Screening  Completed   HPV Vaccine  Aged Out   Meningitis B Vaccine  Aged Out  *Topic was postponed. The date shown is not the original due date.     Objective    BP 136/83   Pulse 74   Resp 16   Ht 5\' 5"  (1.651 m)   Wt 188 lb 3.2 oz (85.4 kg)   SpO2 100%   BMI 31.32 kg/m  BP Readings from Last 3 Encounters:  07/03/23 136/83  02/20/23 (!) 172/89  12/04/21 (!) 186/81    Physical Exam Vitals reviewed.  Constitutional:      Appearance: Normal appearance. She is obese.  HENT:     Head: Normocephalic.     Right Ear: Tympanic membrane, ear canal and external ear normal.     Left Ear: Tympanic membrane, ear canal and external ear normal.     Nose: Nose normal.     Mouth/Throat:     Mouth: Mucous membranes are moist.  Eyes:     Extraocular Movements: Extraocular movements intact.     Pupils: Pupils are equal, round, and reactive to light.  Cardiovascular:     Rate and Rhythm: Normal rate.  Pulmonary:     Effort: Pulmonary effort is normal.     Breath sounds: Normal breath sounds.  Abdominal:     General: Bowel sounds are normal.     Palpations: Abdomen is soft.  Musculoskeletal:        General: Normal range of motion.     Cervical back: Normal range of motion.  Skin:    General: Skin is warm and dry.  Neurological:     Mental Status: She is alert and oriented to person, place, and time.  Psychiatric:        Mood and Affect: Mood normal.        Behavior: Behavior normal.        Thought Content: Thought content normal.       Assessment & Plan:  Jerri was seen today for new patient (initial visit), hypertension and hyperlipidemia.  Diagnoses and all orders for this visit:  Herpes zoster vaccination declined  Food insecurity -     AMB Referral VBCI Care Management  Inadequate housing utilities -     AMB Referral VBCI Care Management  HYPERTENSION, BENIGN SYSTEMIC BP goal - < 140/90 Explained that having normal blood pressure is the goal and medications are helping to get to goal and maintain normal blood pressure. DIET: Limit salt intake, read nutrition labels to check salt content, limit fried and  high fatty foods  Avoid using multisymptom OTC cold preparations that generally contain sudafed which can rise BP. Consult with pharmacist  on best cold relief products to use for persons with HTN EXERCISE Discussed incorporating exercise such as walking - 30 minutes most days of the week and can do in 10 minute intervals    -     CBC with Differential/Platelet -     CMP14+EGFR -     Discontinue: amLODipine  (NORVASC ) 10 MG tablet; Take 1 tablet (10 mg total) by mouth daily. -     amLODipine  (NORVASC ) 10 MG tablet; Take 1 tablet (10 mg total) by mouth daily.  History of breast lump/mass excision  HYPERCHOLESTEROLEMIA -     Lipid panel  TOBACCO USER - I have recommended complete cessation of tobacco use. I have discussed various options available for assistance with tobacco cessation including over the counter methods (Nicotine gum, patch and lozenges). We also discussed prescription options (Chantix, Nicotine Inhaler / Nasal Spray). The patient is not interested in pursuing any prescription tobacco cessation options at this time. - Patient declines at this time.  - Less than 5 minutes spent on counseling.   Cerebrovascular accident (CVA), unspecified mechanism (HCC) -     Discontinue: atorvastatin  (LIPITOR ) 80 MG tablet; Take 1 tablet (80 mg total) by mouth daily. -     Discontinue: clopidogrel  (PLAVIX ) 75 MG tablet; Take 1 tablet (75 mg total) by mouth daily. -     clopidogrel  (PLAVIX ) 75 MG tablet; Take 1 tablet (75 mg total) by mouth daily. -     atorvastatin  (LIPITOR ) 80 MG tablet; Take 1 tablet (80 mg total) by mouth daily.  Encounter for HCV screening test for low risk patient -     HCV Ab w Reflex to Quant PCR  Return in about 3 months (around 10/03/2023) for HTN.  The above assessment and management plan was discussed with the patient. The patient verbalized understanding of and has agreed to the management plan. Patient is aware to call the clinic if symptoms fail to improve or  worsen. Patient is aware when to return to the clinic for a follow-up visit. Patient educated on when it is appropriate to go to the emergency department.   Madelyn Schick, NP-C

## 2023-07-04 ENCOUNTER — Ambulatory Visit (INDEPENDENT_AMBULATORY_CARE_PROVIDER_SITE_OTHER): Payer: Self-pay | Admitting: Primary Care

## 2023-07-04 LAB — LIPID PANEL
Chol/HDL Ratio: 3.4 ratio (ref 0.0–4.4)
Cholesterol, Total: 191 mg/dL (ref 100–199)
HDL: 57 mg/dL (ref >39)
LDL Chol Calc (NIH): 112 mg/dL — ABNORMAL HIGH (ref 0–99)
Triglycerides: 124 mg/dL (ref 0–149)
VLDL Cholesterol Cal: 22 mg/dL (ref 5–40)

## 2023-07-04 LAB — CBC WITH DIFFERENTIAL/PLATELET
Basophils Absolute: 0 10*3/uL (ref 0.0–0.2)
Basos: 0 %
EOS (ABSOLUTE): 0.2 10*3/uL (ref 0.0–0.4)
Eos: 2 %
Hematocrit: 39 % (ref 34.0–46.6)
Hemoglobin: 12.5 g/dL (ref 11.1–15.9)
Immature Grans (Abs): 0 10*3/uL (ref 0.0–0.1)
Immature Granulocytes: 0 %
Lymphocytes Absolute: 2.4 10*3/uL (ref 0.7–3.1)
Lymphs: 28 %
MCH: 26.8 pg (ref 26.6–33.0)
MCHC: 32.1 g/dL (ref 31.5–35.7)
MCV: 84 fL (ref 79–97)
Monocytes Absolute: 0.7 10*3/uL (ref 0.1–0.9)
Monocytes: 8 %
Neutrophils Absolute: 5.4 10*3/uL (ref 1.4–7.0)
Neutrophils: 62 %
Platelets: 394 10*3/uL (ref 150–450)
RBC: 4.67 x10E6/uL (ref 3.77–5.28)
RDW: 14 % (ref 11.7–15.4)
WBC: 8.7 10*3/uL (ref 3.4–10.8)

## 2023-07-04 LAB — CMP14+EGFR
ALT: 35 IU/L — ABNORMAL HIGH (ref 0–32)
AST: 31 IU/L (ref 0–40)
Albumin: 4.3 g/dL (ref 3.9–4.9)
Alkaline Phosphatase: 155 IU/L — ABNORMAL HIGH (ref 44–121)
BUN/Creatinine Ratio: 14 (ref 12–28)
BUN: 12 mg/dL (ref 8–27)
Bilirubin Total: 0.2 mg/dL (ref 0.0–1.2)
CO2: 22 mmol/L (ref 20–29)
Calcium: 9.4 mg/dL (ref 8.7–10.3)
Chloride: 105 mmol/L (ref 96–106)
Creatinine, Ser: 0.85 mg/dL (ref 0.57–1.00)
Globulin, Total: 3.3 g/dL (ref 1.5–4.5)
Glucose: 86 mg/dL (ref 70–99)
Potassium: 4.6 mmol/L (ref 3.5–5.2)
Sodium: 143 mmol/L (ref 134–144)
Total Protein: 7.6 g/dL (ref 6.0–8.5)
eGFR: 77 mL/min/{1.73_m2} (ref >59)

## 2023-07-04 LAB — HCV INTERPRETATION

## 2023-07-04 LAB — HCV AB W REFLEX TO QUANT PCR: HCV Ab: NONREACTIVE

## 2023-07-08 ENCOUNTER — Telehealth: Payer: Self-pay

## 2023-07-08 NOTE — Progress Notes (Signed)
   Telephone encounter was:  Unsuccessful.  07/08/2023 Name: Lindsey Mercer MRN: [id number] DOB: 09-Mar-1960  Unsuccessful outbound call made today to assist with:  Food Insecurity and Financial Difficulties related to financial strain  Outreach Attempt:  1st Attempt  No answer, unable to leave a message   Azell Leopard [facility]  [facility] Guide, Phone: [phone] Fax: [phone] Website: [email or web address]

## 2023-07-09 ENCOUNTER — Telehealth: Payer: Self-pay

## 2023-07-09 NOTE — Progress Notes (Signed)
   Telephone encounter was:  Successful.  Complex Care Management Note Care Guide Note  07/09/2023 Name: Lindsey Mercer MRN: [id number] DOB: 1960/12/20  Lindsey Mercer is a 63 y.o. year old female who is a primary care patient of Marius Siemens, NP . The community resource team was consulted for assistance with Transportation Needs , Food Insecurity, Financial Difficulties related to financial strain , and Housing  SDOH screenings and interventions completed:  Yes  Social Drivers of Health From This Encounter   Food Insecurity: Food Insecurity Present (07/09/2023)   Hunger Vital Sign    Worried About Running Out of Food in the Last Year: Often true    Ran Out of Food in the Last Year: Often true  Housing: High Risk (07/09/2023)   Housing Stability Vital Sign    Unable to Pay for Housing in the Last Year: Yes    Number of Times Moved in the Last Year: 2    Homeless in the Last Year: Yes  Financial Resource Strain: High Risk (07/09/2023)   Overall Financial Resource Strain (CARDIA)    Difficulty of Paying Living Expenses: Very hard  Utilities: At Risk (07/09/2023)   Utilities    Threatened with loss of utilities: Yes    SDOH Interventions Today    Flowsheet Row Most Recent Value  SDOH Interventions   Food Insecurity Interventions [id number] Referral, Community Resources Provided  Housing Interventions Community Resources Provided, [id number] Referral  Utilities Interventions Community Resources Provided, [id number] Referral  Financial Strain Interventions Community Resources Provided, [facility] Referral        Care guide performed the following interventions: Patient provided with information about care guide support team and interviewed to confirm resource needs.Pt had a stroke. Pt and having financial strain and needing Food, Utilities, Medical illustrator assistance. Pt requested I email resources and add referrals to [facility] CARE 360 Email:[email or web address]   Follow Up  Plan:  No further follow up planned at this time. The patient has been provided with needed resources.  Encounter Outcome:  Patient Visit Completed    Azell Leopard [facility]  [facility] Guide, Phone: [phone] Fax: [phone] Website: [email or web address]

## 2023-07-21 ENCOUNTER — Other Ambulatory Visit: Payer: Self-pay

## 2023-07-22 ENCOUNTER — Telehealth: Payer: Self-pay

## 2023-07-29 ENCOUNTER — Telehealth: Payer: Self-pay

## 2023-07-29 NOTE — Progress Notes (Unsigned)
 Complex Care Management Care Guide Note  07/29/2023 Name: Lindsey Mercer MRN: [id number] DOB: [birth year]  Lindsey Mercer is a 63 y.o. year old female who is a primary care patient of Marius Siemens, NP and is actively engaged with the care management team. I reached out to Ceri Moulder by phone today to assist with re-scheduling  with the RN Case Manager.  Follow up plan: Unsuccessful telephone outreach attempt made. A HIPAA compliant phone message was left for the patient providing contact information and requesting a return call.  Creola Doheny [facility], [facility] Guide  Direct Dial: [phone]  Fax [phone]

## 2023-08-05 ENCOUNTER — Other Ambulatory Visit: Payer: Self-pay

## 2023-08-05 NOTE — Patient Outreach (Signed)
 Complex Care Management   Visit Note  08/05/2023  Name:  Lindsey Mercer MRN: [id number] DOB: 1960-08-30  Situation: Referral received for Complex Care Management related to SDOH Barriers:  Food insecurity Utility assistance I obtained verbal consent from Patient.  Visit completed with Meadow Bodie  on the phone  Background:   Past Medical History:  Diagnosis Date   Allergy    Arthritis    DJD (degenerative joint disease)    Hypertension     Assessment: Patient Reported Symptoms:  Cognitive Cognitive Status: Able to follow simple commands, Alert and oriented to person, place, and time, Normal speech and language skills, Struggling with memory recall Cognitive/Intellectual Conditions Management [RPT]: None reported or documented in medical history or problem list      Neurological Neurological Review of Symptoms: No symptoms reported Neurological Conditions: Stroke, ischemic ([place] CVA 01/2023) Neurological Management Strategies: Medication therapy, Routine screening, Exercise Neurological Comment: Note per most recent PCP visit that a referral was sent to neurology. Patient has not heard to schedule appointment, referral not noted per chart review. Message sent to PCP to ask about referral status.  HEENT HEENT Symptoms Reported: No symptoms reported HEENT Conditions: Vision problem(s), Tooth problem(s) Tooth Problems: missing (Upper dentures; missing lower dentures) Vision Problems: blindness/vision loss (Eyeglasses) HEENT Management Strategies: Medical device HEENT Comment: Patient reports needing lower dentures/partial plate. She has not been to her provider who fit her for upper dentures in over a year. Encouraged patient to contact provider to request appointment for lower dentures. Vision problem(s), Tooth problem(s)  Cardiovascular Cardiovascular Symptoms Reported: No symptoms reported Does patient have uncontrolled Hypertension?: No Cardiovascular Conditions: High  blood cholesterol, Hypertension Cardiovascular Management Strategies: Medication therapy  Respiratory Respiratory Symptoms Reported: Dry cough (Dry cough unchanged from baseline, she takes ginger for cough and reports this helps a lot.) Respiratory Comment: Patient denies tobacco use  Endocrine Patient reports the following symptoms related to hypoglycemia or hyperglycemia : No symptoms reported Is patient diabetic?: No    Gastrointestinal Gastrointestinal Symptoms Reported: No symptoms reported (Last BM today) Additional Gastrointestinal Details: Patient reports her appetite is pretty good.   Nutrition Risk Screen (CP): Difficulty chewing/swallowing  Genitourinary Genitourinary Symptoms Reported: No symptoms reported    Integumentary Integumentary Symptoms Reported: Wound (R foot wound on heel) Additional Integumentary Details: Patient reports her primary provider is not aware of wound on her heel. She is currently using peroxide and keeping it dry. She is not using any dressing overtop. Patient states it is small. Denies drainage, redness, or swelling in/around the wound. Skin Conditions: Wound Skin Management Strategies: Coping strategies  Musculoskeletal Musculoskelatal Symptoms Reviewed: No symptoms reported Musculoskeletal Conditions: Osteoarthritis, Joint pain Musculoskeletal Management Strategies: Medication therapy, Exercise Musculoskeletal Comment: Patient reports regular exercise (sit ups, moving legs a lot) Falls in the past year?: No (Patient denies) Number of falls in past year: 1 or less Was there an injury with Fall?: No Fall Risk Category Calculator: 0 Patient Fall Risk Level: Low Fall Risk Patient at Risk for Falls Due to: No Fall Risks Fall risk Follow up: Falls evaluation completed, Education provided  Psychosocial Psychosocial Symptoms Reported: No symptoms reported Behavioral Health Conditions: Depression Behavioral Management Strategies: Medication therapy,  Support system, Coping strategies Techniques to Cope with Loss/Stress/Change: Medication Quality of Family Relationships: helpful, involved, supportive Do you feel physically threatened by others?: No      08/05/2023    9:25 AM  Depression screen PHQ 2/9  Decreased Interest 0  Down, Depressed, Hopeless 0  PHQ -  2 Score 0    Vitals:   08/05/23 0920  BP: 126/80  Pulse: 79    Medications Reviewed Today     Reviewed by Valaria Garland, RN (Registered Nurse) on 08/05/23 at [id number]  Med List Status: <None>   Medication Order Taking? Sig Documenting Provider Last Dose Status Informant  amLODipine  (NORVASC ) 10 MG tablet [id number]  Take 1 tablet (10 mg total) by mouth daily. Marius Siemens, NP  Active   atorvastatin  (LIPITOR ) 80 MG tablet [id number]  Take 1 tablet (80 mg total) by mouth daily. Marius Siemens, NP  Active   clopidogrel  (PLAVIX ) 75 MG tablet [id number]  Take 1 tablet (75 mg total) by mouth daily. Marius Siemens, NP  Active             Recommendation:   Referral to: neurology (message sent to PCP) Continue Current Plan of Care Message sent to care guide asking to resend resources information  Follow Up Plan:   Telephone follow up appointment date/time:  08/12/23 at 9 AM  Theodora Fish, RN MSN [place]  [facility] RN Care Manager Direct Dial: [phone]  Fax: [phone]

## 2023-08-05 NOTE — Patient Instructions (Signed)
 Visit Information  Thank you for taking time to visit with me today. Please don't hesitate to contact me if I can be of assistance to you before our next scheduled appointment.  Our next appointment is by telephone on 08/12/23 at 9 AM Please call the care guide team at [phone] if you need to cancel or reschedule your appointment.   Following is a copy of your care plan:   Goals Addressed             This Visit's Progress    VBCI RN Care Plan   On track    Problems:  Care Coordination needs related to Food Insecurity  Chronic Disease Management support and education needs related to chronic conditions, history of stroke  Goal: Over the next 30 days the Patient will demonstrate Ongoing adherence to prescribed treatment plan for stroke prevention as evidenced by blood pressure and cholesterol management, continuing tobacco cessation, continuing active lifestyle take all medications exactly as prescribed and will call provider for medication related questions as evidenced by patient report of medication adherence    work with community resource care guide to address needs related to Limited access to food as evidenced by patient and/or community resource care guide support     Interventions:   Evaluation of current treatment plan related to chronic health conditions, Limited access to food self-management and patient's adherence to plan as established by provider. Discussed plans with patient for ongoing care management follow up and provided patient with direct contact information for care management team Evaluation of current treatment plan related to stroke prevention and patient's adherence to plan as established by provider Advised patient to contact dental provider to schedule appointment for lower dentures Reviewed medications with patient and discussed importance of medication compliance Reviewed scheduled/upcoming provider appointments including PCP 10/03/23 Discussed plans  with patient for ongoing care management follow up and provided patient with direct contact information for care management team Message sent to care guide asking for resources to be resent to patient  Stroke: Reviewed Importance of taking all medications as prescribed Reviewed Importance of attending all scheduled provider appointments Screening for signs and symptoms of depression related to chronic disease state Assessed social determinant of health barriers Reviewed the importance of exercise Assessed for cognitive impairment Assessed for fall status and safety in the home Assessed use of tobacco use Message sent to PCP regarding neurology referral and to inform of foot wound  Patient Self-Care Activities:  Attend all scheduled provider appointments Call provider office for new concerns or questions  Perform all self care activities independently  Take medications as prescribed    Plan:  Telephone follow up appointment with care management team member scheduled for:  08/12/23 at 9 AM             Please call the Suicide and Crisis Lifeline: 988 call 1-800-273-TALK (toll free, 24 hour hotline) if you are experiencing a Mental Health or Behavioral Health Crisis or need someone to talk to.  The patient verbalized understanding of instructions, educational materials, and care plan provided today and DECLINED offer to receive copy of patient instructions, educational materials, and care plan.   Theodora Fish, RN MSN   [facility] RN Care Manager Direct Dial: [phone]  Fax: [phone]

## 2023-08-12 ENCOUNTER — Telehealth: Payer: Self-pay

## 2023-08-15 ENCOUNTER — Other Ambulatory Visit: Payer: Self-pay

## 2023-08-15 NOTE — Patient Instructions (Signed)
 Visit Information  Thank you for taking time to visit with me today. Please don't hesitate to contact me if I can be of assistance to you before our next scheduled appointment.  Your next care management appointment is by telephone on 08/29/23 at 2:30 PM  Please call the care guide team at [phone] if you need to cancel, schedule, or reschedule an appointment.   Please call the Suicide and Crisis Lifeline: 988 call 1-800-273-TALK (toll free, 24 hour hotline) if you are experiencing a Mental Health or Behavioral Health Crisis or need someone to talk to.  Rosaline Finlay, RN MSN [place]  VBCI Population Health RN Care Manager Direct Dial: [phone]  Fax: [phone]

## 2023-08-15 NOTE — Patient Outreach (Signed)
 Complex Care Management   Visit Note  08/15/2023  Name:  Lindsey Mercer MRN: [id number] DOB: 1960-06-10  Situation: Referral received for Complex Care Management related to SDOH Barriers:  Financial Resource Strain I obtained verbal consent from Patient.  Visit completed with Lindsey Mercer  on the phone  Background:   Past Medical History:  Diagnosis Date   Allergy    Arthritis    DJD (degenerative joint disease)    Hypertension     Assessment: Patient Reported Symptoms:  Cognitive Cognitive Status: Able to follow simple commands, Alert and oriented to person, place, and time, Normal speech and language skills, Struggling with memory recall Cognitive/Intellectual Conditions Management [RPT]: None reported or documented in medical history or problem list      Neurological Neurological Review of Symptoms: No symptoms reported Neurological Conditions: Stroke, ischemic ([place] CVA 01/2023) Neurological Management Strategies: Medication therapy, Routine screening, Exercise Neurological Comment: Note that neuro office attempted to reach patient x3 but were unable to schedule appointment. Called office with patient on the phone to schedule appointment. No answer. Left voicemail providing patient's number and asked them to call her back to schedule an appointment.  HEENT HEENT Symptoms Reported: Not assessed      Cardiovascular Cardiovascular Symptoms Reported: Not assessed    Respiratory Respiratory Symptoms Reported: Not assesed    Endocrine Patient reports the following symptoms related to hypoglycemia or hyperglycemia : Not assessed    Gastrointestinal Gastrointestinal Symptoms Reported: Not assessed      Genitourinary Genitourinary Symptoms Reported: Not assessed    Integumentary Integumentary Symptoms Reported: Wound (R heel) Additional Integumentary Details: Patient reports the wound on her heel is getting smaller Skin Conditions: Wound Skin Management Strategies:  Coping strategies  Musculoskeletal Musculoskelatal Symptoms Reviewed: Not assessed        Psychosocial Psychosocial Symptoms Reported: Not assessed            08/05/2023    9:25 AM  Depression screen PHQ 2/9  Decreased Interest 0  Down, Depressed, Hopeless 0  PHQ - 2 Score 0    There were no vitals filed for this visit.  Medications Reviewed Today     Reviewed by Lindsey Lindsey SQUIBB, RN (Registered Nurse) on 08/15/23 at 1428  Med List Status: <None>   Medication Order Taking? Sig Documenting Provider Last Dose Status Informant  amLODipine  (NORVASC ) 10 MG tablet [id number]  Take 1 tablet (10 mg total) by mouth daily. Lindsey Lindsey SQUIBB, NP  Active   atorvastatin  (LIPITOR ) 80 MG tablet [id number]  Take 1 tablet (80 mg total) by mouth daily. Lindsey Lindsey SQUIBB, NP  Active   clopidogrel  (PLAVIX ) 75 MG tablet [id number]  Take 1 tablet (75 mg total) by mouth daily. Lindsey Lindsey SQUIBB, NP  Active             Recommendation:   Continue Current Plan of Care  Follow Up Plan:   Telephone follow up appointment date/time:  08/29/23 at 2:30 PM  Lindsey Arno, RN MSN   [facility] RN Care Manager Direct Dial: [phone]  Fax: [phone]

## 2023-08-18 ENCOUNTER — Ambulatory Visit: Payer: Self-pay | Attending: Internal Medicine | Admitting: Physical Therapy

## 2023-08-18 NOTE — Therapy (Incomplete)
 OUTPATIENT PHYSICAL THERAPY NEURO EVALUATION   Patient Name: Lindsey Mercer MRN: [id number] DOB:06-15-1960, 63 y.o., female Today's Date: 08/18/2023   PCP: *** REFERRING PROVIDER: ***  END OF SESSION:   Past Medical History:  Diagnosis Date   Allergy    Arthritis    DJD (degenerative joint disease)    Hypertension    Past Surgical History:  Procedure Laterality Date   ABDOMINAL HYSTERECTOMY     1993   Patient Active Problem List   Diagnosis Date Noted   Acute CVA (cerebrovascular accident) (HCC) 02/17/2023   Breast lump on right side at 1 o'clock position 08/31/2013   Breast lump on left side at 6 o'clock position 12/16/2011   CORTICAL CATARACTS 07/25/2009   TOBACCO USER 10/24/2008   Lumbago 06/29/2008   Personal history presenting hazards to health 03/03/2008   PAIN, CHRONIC NEC 12/12/2006   OBESITY 11/24/2006   HOT FLASHES 11/24/2006   Allergic rhinitis 10/07/2006   SYNDROME, CHRONIC PAIN 07/30/2006   Brachial neuritis or radiculitis 06/27/2006   HYPERCHOLESTEROLEMIA 04/17/2006   DEPRESSIVE DISORDER, NOS 04/17/2006   HYPERTENSION, BENIGN SYSTEMIC 04/17/2006   ARTHRITIS, DEGENERATIVE 04/17/2006    ONSET DATE: ***  REFERRING DIAG: ***  THERAPY DIAG:  No diagnosis found.  Rationale for Evaluation and Treatment: {HABREHAB:27488}  SUBJECTIVE:                                                                                                                                                                                             SUBJECTIVE STATEMENT: *** Pt accompanied by: {accompnied:27141}  PERTINENT HISTORY: ***  PAIN:  Are you having pain? {OPRCPAIN:27236}  PRECAUTIONS: {Therapy precautions:24002}  RED FLAGS: {PT Red Flags:29287}   WEIGHT BEARING RESTRICTIONS: {Yes ***/No:24003}  FALLS: Has patient fallen in last 6 months? {fallsyesno:27318}  LIVING ENVIRONMENT: Lives with: {OPRC lives with:25569::lives with their family} Lives in: {Lives  in:25570} Stairs: {opstairs:27293} Has following equipment at home: {Assistive devices:23999}  PLOF: {PLOF:24004}  PATIENT GOALS: ***  OBJECTIVE:  Note: Objective measures were completed at Evaluation unless otherwise noted.  DIAGNOSTIC FINDINGS: ***  COGNITION: Overall cognitive status: {cognition:24006}   SENSATION: {sensation:27233}  COORDINATION: ***  EDEMA:  {edema:24020}  MUSCLE TONE: {LE tone:25568}  MUSCLE LENGTH: Hamstrings: Right *** deg; Left *** deg Debby test: Right *** deg; Left *** deg  DTRs:  {DTR SITE:24025}  POSTURE: {posture:25561}  LOWER EXTREMITY ROM:     {AROM/PROM:27142}  Right Eval Left Eval  Hip flexion    Hip extension    Hip abduction    Hip adduction    Hip internal rotation    Hip external rotation  Knee flexion    Knee extension    Ankle dorsiflexion    Ankle plantarflexion    Ankle inversion    Ankle eversion     (Blank rows = not tested)  LOWER EXTREMITY MMT:    MMT Right Eval Left Eval  Hip flexion    Hip extension    Hip abduction    Hip adduction    Hip internal rotation    Hip external rotation    Knee flexion    Knee extension    Ankle dorsiflexion    Ankle plantarflexion    Ankle inversion    Ankle eversion    (Blank rows = not tested)  BED MOBILITY:  {bed mobility:32615:p}  TRANSFERS: {transfers eval:32620}  RAMP:  {ramp eval:32616}  CURB:  {curb eval:32617}  STAIRS: {stairs eval:32618} GAIT: Findings: {GaitneuroPT:32644::Distance walked: ***,Comments: ***}  FUNCTIONAL TESTS:  {Functional tests:24029}  PATIENT SURVEYS:  {rehab surveys:24030}                                                                                                                              TREATMENT DATE: ***    PATIENT EDUCATION: Education details: *** Person educated: {Person educated:25204} Education method: {Education Method:25205} Education comprehension: {Education  Comprehension:25206}  HOME EXERCISE PROGRAM: ***  GOALS: Goals reviewed with patient? {yes/no:20286}  SHORT TERM GOALS: Target date: ***  *** Baseline: Goal status: INITIAL  2.  *** Baseline:  Goal status: INITIAL  3.  *** Baseline:  Goal status: INITIAL  4.  *** Baseline:  Goal status: INITIAL  5.  *** Baseline:  Goal status: INITIAL  6.  *** Baseline:  Goal status: INITIAL  LONG TERM GOALS: Target date: ***  *** Baseline:  Goal status: INITIAL  2.  *** Baseline:  Goal status: INITIAL  3.  *** Baseline:  Goal status: INITIAL  4.  *** Baseline:  Goal status: INITIAL  5.  *** Baseline:  Goal status: INITIAL  6.  *** Baseline:  Goal status: INITIAL  ASSESSMENT:  CLINICAL IMPRESSION: Patient is a *** y.o. *** who was seen today for physical therapy evaluation and treatment for ***.   OBJECTIVE IMPAIRMENTS: {opptimpairments:25111}.   ACTIVITY LIMITATIONS: {activitylimitations:27494}  PARTICIPATION LIMITATIONS: {participationrestrictions:25113}  PERSONAL FACTORS: {Personal factors:25162} are also affecting patient's functional outcome.   REHAB POTENTIAL: {rehabpotential:25112}  CLINICAL DECISION MAKING: {clinical decision making:25114}  EVALUATION COMPLEXITY: {Evaluation complexity:25115}  PLAN:  PT FREQUENCY: {rehab frequency:25116}  PT DURATION: {rehab duration:25117}  PLANNED INTERVENTIONS: {rehab planned interventions:25118::97110-Therapeutic exercises,97530- Therapeutic [id number]- Neuromuscular re-education,97535- Self Rjmz,02859- Manual therapy}  PLAN FOR NEXT SESSION: ***   Waddell Southgate, PT Waddell Southgate, PT, DPT, CSRS  08/18/2023, 7:44 AM

## 2023-08-21 ENCOUNTER — Telehealth: Payer: Self-pay | Admitting: *Deleted

## 2023-08-21 NOTE — Progress Notes (Signed)
 Complex Care Management Care Guide Note  08/21/2023 Name: Lindsey Mercer MRN: [id number] DOB: February 03, 1961  Lindsey Mercer is a 63 y.o. year old female who is a primary care patient of Celestia Rosaline SQUIBB, NP and is actively engaged with the care management team. I reached out to Eulia Kawano by phone today to assist with re-scheduling  with the BSW.  Follow up plan: Unsuccessful telephone outreach attempt made. A HIPAA compliant phone message was left for the patient providing contact information and requesting a return call.  Harlene Satterfield  [facility]  Value-Based Care Institute, [facility] Guide  Direct Dial: [phone]  Fax [phone]

## 2023-08-26 NOTE — Progress Notes (Unsigned)
 Complex Care Management Care Guide Note  08/26/2023 Name: Lindsey Mercer MRN: [id number] DOB: 02-26-1960  Lindsey Mercer is a 63 y.o. year old female who is a primary care patient of Celestia Rosaline SQUIBB, NP and is actively engaged with the care management team. I reached out to Marisal Zurita by phone today to assist with scheduling  with the BSW.  Follow up plan: Unsuccessful telephone outreach attempt made. A HIPAA compliant phone message was left for the patient providing contact information and requesting a return call.  Harlene Satterfield  [facility]  Value-Based Care Institute, [facility] Guide  Direct Dial: [phone]  Fax [phone]

## 2023-08-27 NOTE — Progress Notes (Signed)
 Complex Care Management Care Guide Note  08/27/2023 Name: Lindsey Mercer MRN: [id number] DOB: 02-Feb-1961  Lindsey Mercer is a 63 y.o. year old female who is a primary care patient of Celestia Rosaline SQUIBB, NP and is actively engaged with the care management team. I reached out to Thanh Bulow by phone today to assist with scheduling  with the BSW.  Follow up plan: Unsuccessful telephone outreach attempt made. A HIPAA compliant phone message was left for the patient providing contact information and requesting a return call. No further outreach attempts will be made at this time. We have been unable to contact the patient to reschedule for complex care management services.   Harlene Satterfield  [facility]  Value-Based Care Institute, [facility] Guide  Direct Dial: [phone]  Fax [phone]

## 2023-08-29 ENCOUNTER — Other Ambulatory Visit: Payer: Self-pay

## 2023-08-29 NOTE — Patient Instructions (Signed)
 Visit Information  Thank you for taking time to visit with me today. Please don't hesitate to contact me if I can be of assistance to you before our next scheduled appointment.  Your next care management appointment is by telephone on 09/29/23 at 1 PM   Please call the care guide team at [phone] if you need to cancel, schedule, or reschedule an appointment.   Please call the Suicide and Crisis Lifeline: 988 call 1-800-273-TALK (toll free, 24 hour hotline) if you are experiencing a Mental Health or Behavioral Health Crisis or need someone to talk to.  Rosaline Finlay, RN MSN [place]  VBCI Population Health RN Care Manager Direct Dial: [phone]  Fax: [phone]

## 2023-08-29 NOTE — Patient Outreach (Signed)
 Complex Care Management   Visit Note  08/29/2023  Name:  Lindsey Mercer MRN: [id number] DOB: 1960/09/25  Situation: Referral received for Complex Care Management related to SDOH Barriers:  Housing   Food insecurity I obtained verbal consent from Patient.  Visit completed with Denicia Tedeschi  on the phone  Background:   Past Medical History:  Diagnosis Date   Allergy    Arthritis    DJD (degenerative joint disease)    Hypertension     Assessment: Patient Reported Symptoms:  Cognitive Cognitive Status: Able to follow simple commands, Alert and oriented to person, place, and time, Normal speech and language skills Cognitive/Intellectual Conditions Management [RPT]: None reported or documented in medical history or problem list      Neurological Neurological Review of Symptoms: No symptoms reported Neurological Management Strategies: Medication therapy, Routine screening, Exercise Neurological Comment: Patient reports neuro office did reach out to schedule an appointment. She declined scheduling a visit at that time. Patient reports she has remained compliant with medications and has been increasing exercise. She feels that her memory has improved since initial CMRN visit.  HEENT HEENT Symptoms Reported: Not assessed      Cardiovascular Cardiovascular Symptoms Reported: No symptoms reported Does patient have uncontrolled Hypertension?: No (117/22 per patient) Cardiovascular Management Strategies: Medication therapy, Exercise  Respiratory Respiratory Symptoms Reported: Not assesed    Endocrine Endocrine Symptoms Reported: Not assessed    Gastrointestinal Gastrointestinal Symptoms Reported: Not assessed      Genitourinary Genitourinary Symptoms Reported: Not assessed    Integumentary Integumentary Symptoms Reported: Wound Additional Integumentary Details: Patient reports wound on her heel continues to get smaller. She has been keeping it clean. Skin Management Strategies:  Coping strategies  Musculoskeletal Musculoskelatal Symptoms Reviewed: Unsteady gait Musculoskeletal Management Strategies: Exercise Musculoskeletal Comment: Patient reports that she has been getting out and walking more, which has been very helpful Falls in the past year?: No Number of falls in past year: 1 or less Was there an injury with Fall?: No Fall Risk Category Calculator: 0 Patient Fall Risk Level: Low Fall Risk Patient at Risk for Falls Due to: Impaired balance/gait Fall risk Follow up: Falls evaluation completed, Education provided, Falls prevention discussed  Psychosocial Psychosocial Symptoms Reported: Not assessed            08/05/2023    9:25 AM  Depression screen PHQ 2/9  Decreased Interest 0  Down, Depressed, Hopeless 0  PHQ - 2 Score 0    There were no vitals filed for this visit.  Medications Reviewed Today     Reviewed by Arno Rosaline SQUIBB, RN (Registered Nurse) on 08/29/23 at 1450  Med List Status: <None>   Medication Order Taking? Sig Documenting Provider Last Dose Status Informant  amLODipine  (NORVASC ) 10 MG tablet [id number] Yes Take 1 tablet (10 mg total) by mouth daily. Celestia Rosaline SQUIBB, NP  Active   atorvastatin  (LIPITOR ) 80 MG tablet [id number] Yes Take 1 tablet (80 mg total) by mouth daily. Celestia Rosaline SQUIBB, NP  Active   clopidogrel  (PLAVIX ) 75 MG tablet [id number] Yes Take 1 tablet (75 mg total) by mouth daily. Celestia Rosaline SQUIBB, NP  Active             Recommendation:   Continue Current Plan of Care  Follow Up Plan:   Telephone follow up appointment date/time:  09/29/23 at 1 PM  Rosaline Arno, RN MSN   [facility] RN Care Manager Direct Dial: [phone]  Fax: [phone]

## 2023-09-05 ENCOUNTER — Other Ambulatory Visit: Payer: Self-pay

## 2023-09-05 NOTE — Patient Outreach (Signed)
 BSW initiated call with patient for housing resources. While on the call and covering SDOH needs, patient stated BSW was asking too many questions, and she does her own, good bye and proceeded to end the call. BSW dialed back twice but no answer. BSW left VM to call back if patient wished to continue call. Harlene Netters, care guide was also notified.

## 2023-09-29 ENCOUNTER — Other Ambulatory Visit: Payer: Self-pay

## 2023-09-29 NOTE — Patient Outreach (Signed)
 Complex Care Management   Visit Note  09/29/2023  Name:  Lindsey Mercer MRN: [id number] DOB: Feb 18, 1961  Situation: Referral received for Complex Care Management related to SDOH Barriers:  housing assistance I obtained verbal consent from Patient.  Visit completed with [name]  on the phone  Background:   Past Medical History:  Diagnosis Date   Allergy    Arthritis    DJD (degenerative joint disease)    Hypertension     Assessment: Patient Reported Symptoms:  Cognitive Cognitive Status: Able to follow simple commands, Alert and oriented to person, place, and time, Normal speech and language skills Cognitive/Intellectual Conditions Management [RPT]: None reported or documented in medical history or problem list      Neurological Neurological Review of Symptoms: No symptoms reported Neurological Management Strategies: Medication therapy, Routine screening, Exercise  HEENT HEENT Symptoms Reported: Not assessed      Cardiovascular Cardiovascular Symptoms Reported: Not assessed    Respiratory Respiratory Symptoms Reported: Not assesed    Endocrine Endocrine Symptoms Reported: Not assessed    Gastrointestinal Gastrointestinal Symptoms Reported: Not assessed      Genitourinary Genitourinary Symptoms Reported: Not assessed    Integumentary Integumentary Symptoms Reported: Not assessed    Musculoskeletal Musculoskelatal Symptoms Reviewed: Unsteady gait Musculoskeletal Management Strategies: Exercise Falls in the past year?: No Number of falls in past year: 1 or less Was there an injury with Fall?: No Fall Risk Category Calculator: 0 Patient Fall Risk Level: Low Fall Risk Patient at Risk for Falls Due to: Impaired balance/gait Fall risk Follow up: Falls evaluation completed, Education provided, Falls prevention discussed  Psychosocial Psychosocial Symptoms Reported: Not assessed            08/05/2023    9:25 AM  Depression screen PHQ 2/9  Decreased Interest 0   Down, Depressed, Hopeless 0  PHQ - 2 Score 0    There were no vitals filed for this visit.  Medications Reviewed Today     Reviewed by Arno Rosaline SQUIBB, RN (Registered Nurse) on 09/29/23 at 1320  Med List Status: <None>   Medication Order Taking? Sig Documenting Provider Last Dose Status Informant  amLODipine  (NORVASC ) 10 MG tablet [id number] Yes Take 1 tablet (10 mg total) by mouth daily. Celestia Rosaline SQUIBB, NP  Active   atorvastatin  (LIPITOR ) 80 MG tablet [id number] Yes Take 1 tablet (80 mg total) by mouth daily. Celestia Rosaline SQUIBB, NP  Active   clopidogrel  (PLAVIX ) 75 MG tablet [id number] Yes Take 1 tablet (75 mg total) by mouth daily. Celestia Rosaline SQUIBB, NP  Active             Recommendation:   Continue Current Plan of Care Patient has been provided BSW contact information  Follow Up Plan:   No further CMRN needs identified at this time. Patient is compliant with medications and denies new issues or concerns. Will continue to stay on care team while patient works with BSW.  Rosaline Arno, RN MSN [place]  VBCI Population Health RN Care Manager Direct Dial: [phone]  Fax: [phone]

## 2023-09-29 NOTE — Patient Instructions (Signed)
 Visit Information  Thank you for taking time to visit with me today. Please don't hesitate to contact me if I can be of assistance to you before our next scheduled appointment.  Your next care management appointment is no further scheduled appointments.   Patient will call [place], BSW, regarding housing resources.  Please call the care guide team at [phone] if you need to cancel, schedule, or reschedule an appointment.   Please call the Suicide and Crisis Lifeline: 988 call 1-800-273-TALK (toll free, 24 hour hotline) if you are experiencing a Mental Health or Behavioral Health Crisis or need someone to talk to.  Rosaline Finlay, RN MSN [place]  VBCI Population Health RN Care Manager Direct Dial: [phone]  Fax: [phone]

## 2023-10-03 ENCOUNTER — Ambulatory Visit (INDEPENDENT_AMBULATORY_CARE_PROVIDER_SITE_OTHER): Payer: Self-pay | Admitting: Primary Care

## 2023-10-14 ENCOUNTER — Other Ambulatory Visit: Payer: Self-pay

## 2023-10-14 NOTE — Patient Outreach (Signed)
 BSW made multiple outreach attempts during scheduled appt without any success. BSW left VM requesting call back to reschedule otherwise care guide will reach out to reschedule.

## 2023-10-16 ENCOUNTER — Other Ambulatory Visit: Payer: Self-pay

## 2023-10-16 NOTE — Patient Instructions (Signed)
 Visit Information  Thank you for taking time to visit with me today. Please don't hesitate to contact me if I can be of assistance to you before our next scheduled appointment.  Our next appointment is by telephone on 10/27/2023 at 10AM Please call the care guide team at [phone] if you need to cancel or reschedule your appointment.   Following is a copy of your care plan:   Goals Addressed             This Visit's Progress    BSW VBCI Social Work Care Plan   On track    Problems:   Food Insecurity  and Housing   CSW Clinical Goal(s):   Over the next 2 weeks the Patient will work with Child psychotherapist to address concerns related to food insecurity and housing.  Interventions:  Provide community resources for SDOH needs.   Patient Goals/Self-Care Activities:  Access food pantries shared with pt via email.   Plan:   Telephone follow up appointment with care management team member scheduled for:  10/27/2023 at 10AM.        Please call the Suicide and Crisis Lifeline: 988 go to [facility] [address], [place] [phone]) call 911 if you are experiencing a Mental Health or Behavioral Health Crisis or need someone to talk to.  The patient verbalized understanding of instructions, educational materials, and care plan provided today and DECLINED offer to receive copy of patient instructions, educational materials, and care plan.   Laymon Doll, BSW [place]/VBCI - [employer] Social Worker [phone]

## 2023-10-16 NOTE — Patient Outreach (Signed)
 Complex Care Management   Visit Note  10/16/2023  Name:  Lindsey Mercer MRN: [id number] DOB: June 04, 1960  Situation: Referral received for Complex Care Management related to SDOH Barriers:  Housing finding housing Food insecurity I obtained verbal consent from Patient.  Visit completed with Patient  on the phone  Background:   Past Medical History:  Diagnosis Date   Allergy    Arthritis    DJD (degenerative joint disease)    Hypertension     Assessment: BSW held initial call with pt. Pt was alert and cognitive. Pt reports her and her partner are living with partner cousin in [place]. SDOH needs were assessed and the following needs were identified: food insecurity and housing. Pt reports no FNS benefit and relies on fixed income to obtain food. Pt was provided food resources via email. Pt was also provided with [facility] contact information to inquire about applying for subsidized housing waitlist. Additionally, Pt was given a list of housing vacancies in [employer] for her to reach out to. BSW reminded pt of upcoming appts. Pt confirmed no RNCM needs at this time. No other resources were provided/requested at this time.   SDOH Interventions    Flowsheet Row Patient Outreach Telephone from 10/16/2023 in [place] POPULATION HEALTH DEPARTMENT Patient Outreach Telephone from 08/15/2023 in [place] POPULATION HEALTH DEPARTMENT Patient Outreach Telephone from 08/05/2023 in [place] POPULATION HEALTH DEPARTMENT Telephone from 07/09/2023 in [place] POPULATION HEALTH DEPARTMENT Office Visit from 07/03/2023 in [facility]  SDOH Interventions       Food Insecurity Interventions Community Resources Provided Other (Comment)  [Patient continues to report that she has not received resources from care guide. Message sent to care guide asking to reach back out with resources.] Other (Comment)  [Care guide has provided information, patient is unsure if she received  information. Message sent to care guide asking to reach back out.] [id number] Referral, Community Resources Provided AMB Referral  Housing Interventions --  [BSW shared housing resources with pt via email.] Other (Comment)  [Patient is currently looking for housing to move out on her own. She is asking for resources for housing. Patient continues to report that she has not received resources from care guide. Message sent to care guide asking to reach back out with resources.] Intervention Not Indicated Community Resources Provided, [id number] Referral Intervention Not Indicated  Transportation Interventions Intervention Not Indicated -- Intervention Not Indicated -- Intervention Not Indicated  Utilities Interventions Intervention Not Indicated Intervention Not Indicated Intervention Not Indicated Community Resources Provided, [id number] Referral AMB Referral  Financial Strain Interventions Community Resources Provided -- -- [employer] Provided, [id number] Referral --      Recommendation:   Review housing resource and contact for availability.   Follow Up Plan:   Telephone follow up appointment date/time:  10/27/2023 at 10AM.   Laymon Doll, BSW [place] Social Worker [phone]

## 2023-10-21 ENCOUNTER — Encounter (INDEPENDENT_AMBULATORY_CARE_PROVIDER_SITE_OTHER): Payer: Self-pay

## 2023-10-21 ENCOUNTER — Ambulatory Visit (INDEPENDENT_AMBULATORY_CARE_PROVIDER_SITE_OTHER): Payer: Self-pay | Admitting: Primary Care

## 2023-10-23 ENCOUNTER — Telehealth: Payer: Self-pay

## 2023-10-27 ENCOUNTER — Other Ambulatory Visit: Payer: Self-pay

## 2023-10-27 NOTE — Patient Outreach (Addendum)
 BSW attempted outreach during scheduled appt time with pt twice without success. BSW will not continue outreach efforts at this time since patient has 3 no shows. BSW will notify RNCM michelle steffens that he is dropping from the care team.

## 2023-11-03 ENCOUNTER — Telehealth: Payer: Self-pay

## 2023-11-17 ENCOUNTER — Other Ambulatory Visit (INDEPENDENT_AMBULATORY_CARE_PROVIDER_SITE_OTHER): Payer: Self-pay | Admitting: Primary Care

## 2023-11-17 ENCOUNTER — Ambulatory Visit (INDEPENDENT_AMBULATORY_CARE_PROVIDER_SITE_OTHER): Payer: Self-pay | Admitting: Primary Care

## 2023-11-17 DIAGNOSIS — I10 Essential (primary) hypertension: Secondary | ICD-10-CM

## 2023-11-18 NOTE — Telephone Encounter (Unsigned)
 Copied from CRM [id number]. Topic: Clinical - Medication Refill >> Nov 17, 2023  4:50 PM Rea ORN wrote: Medication:  amLODipine  (NORVASC ) 10 MG tablet  Has the patient contacted their pharmacy? No (Agent: If no, request that the patient contact the pharmacy for the refill. If patient does not wish to contact the pharmacy document the reason why and proceed with request.) (Agent: If yes, when and what did the pharmacy advise?)  This is the patient's preferred pharmacy:  [place] [facility] [address] [place] [address] Phone: [phone] Fax: [phone]  Is this the correct pharmacy for this prescription? Yes If no, delete pharmacy and type the correct one.   Has the prescription been filled recently? No  Is the patient out of the medication? No  Has the patient been seen for an appointment in the last year OR does the patient have an upcoming appointment? Yes  Can we respond through MyChart? No  Agent: Please be advised that Rx refills may take up to 3 business days. We ask that you follow-up with your pharmacy.

## 2023-11-19 ENCOUNTER — Other Ambulatory Visit (HOSPITAL_COMMUNITY): Payer: Self-pay

## 2023-11-19 ENCOUNTER — Other Ambulatory Visit: Payer: Self-pay

## 2023-11-19 MED ORDER — AMLODIPINE BESYLATE 10 MG PO TABS
10.0000 mg | ORAL_TABLET | Freq: Every day | ORAL | 0 refills | Status: DC
  Filled 2023-11-19: qty 90, 90d supply, fill #0

## 2023-11-19 NOTE — Telephone Encounter (Signed)
 Requested Prescriptions  Pending Prescriptions Disp Refills   amLODipine  (NORVASC ) 10 MG tablet 90 tablet 0    Sig: Take 1 tablet (10 mg total) by mouth daily.     Cardiovascular: Calcium  Channel Blockers 2 Passed - 11/19/2023 12:38 PM      Passed - Last BP in normal range    BP Readings from Last 1 Encounters:  08/05/23 126/80         Passed - Last Heart Rate in normal range    Pulse Readings from Last 1 Encounters:  08/05/23 79         Passed - Valid encounter within last 6 months    Recent Outpatient Visits           4 months ago Herpes zoster vaccination declined   [place] [facility] Celestia Rosaline SQUIBB, NP

## 2023-11-28 ENCOUNTER — Other Ambulatory Visit (HOSPITAL_COMMUNITY): Payer: Self-pay

## 2023-12-16 ENCOUNTER — Telehealth (INDEPENDENT_AMBULATORY_CARE_PROVIDER_SITE_OTHER): Payer: Self-pay | Admitting: Primary Care

## 2023-12-16 NOTE — Telephone Encounter (Signed)
 Called pt to confirm appt. Pt did not answer and LVM for pt to call back.

## 2023-12-18 ENCOUNTER — Ambulatory Visit (INDEPENDENT_AMBULATORY_CARE_PROVIDER_SITE_OTHER): Payer: Self-pay | Admitting: Primary Care

## 2024-01-30 ENCOUNTER — Ambulatory Visit (INDEPENDENT_AMBULATORY_CARE_PROVIDER_SITE_OTHER): Payer: Self-pay | Admitting: Primary Care

## 2024-02-24 ENCOUNTER — Ambulatory Visit (INDEPENDENT_AMBULATORY_CARE_PROVIDER_SITE_OTHER): Payer: Self-pay | Admitting: Primary Care

## 2024-03-02 ENCOUNTER — Other Ambulatory Visit (INDEPENDENT_AMBULATORY_CARE_PROVIDER_SITE_OTHER): Payer: Self-pay | Admitting: Primary Care

## 2024-03-02 ENCOUNTER — Telehealth (INDEPENDENT_AMBULATORY_CARE_PROVIDER_SITE_OTHER): Payer: Self-pay | Admitting: Primary Care

## 2024-03-02 DIAGNOSIS — I10 Essential (primary) hypertension: Secondary | ICD-10-CM

## 2024-03-02 NOTE — Telephone Encounter (Unsigned)
 Copied from CRM [id number]. Topic: Clinical - Medication Refill >> Mar 02, 2024 11:17 AM Victoria B wrote: Medication: amLODipine  (NORVASC ) 10 MG tablet  Has the patient contacted their pharmacy? no   This is the patient's preferred pharmacy:  [place] [facility] [address] [place] [address] Phone: [phone] Fax: [phone]  Is this the correct pharmacy for this prescription? yes    Has the prescription been filled recently? no  Is the patient out of the medication? Has1 left  Has the patient been seen for an appointment in the last year OR does the patient have an upcoming appointment? yes  Can we respond through MyChart? no  Agent: Please be advised that Rx refills may take up to 3 business days. We ask that you follow-up with your pharmacy.

## 2024-03-02 NOTE — Telephone Encounter (Signed)
 Spoke to pt about upcoming appt.. Will be present

## 2024-03-03 ENCOUNTER — Telehealth (INDEPENDENT_AMBULATORY_CARE_PROVIDER_SITE_OTHER): Payer: Self-pay | Admitting: Primary Care

## 2024-03-03 ENCOUNTER — Ambulatory Visit (INDEPENDENT_AMBULATORY_CARE_PROVIDER_SITE_OTHER): Payer: Self-pay | Admitting: Primary Care

## 2024-03-03 NOTE — Telephone Encounter (Signed)
 Called pt to see if pt was coming to appt. Pt did not answer

## 2024-03-03 NOTE — Telephone Encounter (Signed)
 Requested medication (s) are due for refill today: yes  Requested medication (s) are on the active medication list: yes  Last refill:  11/19/23 #90 0 refills  Future visit scheduled: yes today   Notes to clinic:  do you want to give courtesy refill or refill after today's OV?     Requested Prescriptions  Pending Prescriptions Disp Refills   amLODipine  (NORVASC ) 10 MG tablet 90 tablet 0    Sig: Take 1 tablet (10 mg total) by mouth daily.     Cardiovascular: Calcium  Channel Blockers 2 Failed - 03/03/2024 12:03 PM      Failed - Valid encounter within last 6 months    Recent Outpatient Visits           8 months ago Herpes zoster vaccination declined   [place] [facility] Celestia Rosaline SQUIBB, NP              Passed - Last BP in normal range    BP Readings from Last 1 Encounters:  08/05/23 126/80         Passed - Last Heart Rate in normal range    Pulse Readings from Last 1 Encounters:  08/05/23 79

## 2024-03-03 NOTE — Telephone Encounter (Signed)
 Pt stated she overslept and was rescheduled for a different day

## 2024-03-05 ENCOUNTER — Telehealth: Payer: Self-pay | Admitting: Primary Care

## 2024-03-05 NOTE — Telephone Encounter (Signed)
 03/05/24 Left VM to confirm patient appointment time, date, and address

## 2024-03-09 ENCOUNTER — Encounter (INDEPENDENT_AMBULATORY_CARE_PROVIDER_SITE_OTHER): Payer: Self-pay | Admitting: Primary Care

## 2024-03-09 ENCOUNTER — Other Ambulatory Visit (HOSPITAL_COMMUNITY): Payer: Self-pay

## 2024-03-09 ENCOUNTER — Ambulatory Visit: Payer: Self-pay | Admitting: Primary Care

## 2024-03-09 ENCOUNTER — Ambulatory Visit (INDEPENDENT_AMBULATORY_CARE_PROVIDER_SITE_OTHER): Payer: Self-pay | Admitting: Primary Care

## 2024-03-09 VITALS — BP 137/70 | HR 76 | Resp 16 | Ht 65.0 in | Wt 196.0 lb

## 2024-03-09 DIAGNOSIS — Z1211 Encounter for screening for malignant neoplasm of colon: Secondary | ICD-10-CM

## 2024-03-09 DIAGNOSIS — D519 Vitamin B12 deficiency anemia, unspecified: Secondary | ICD-10-CM

## 2024-03-09 DIAGNOSIS — E7800 Pure hypercholesterolemia, unspecified: Secondary | ICD-10-CM

## 2024-03-09 DIAGNOSIS — Z1231 Encounter for screening mammogram for malignant neoplasm of breast: Secondary | ICD-10-CM

## 2024-03-09 DIAGNOSIS — I639 Cerebral infarction, unspecified: Secondary | ICD-10-CM

## 2024-03-09 DIAGNOSIS — E559 Vitamin D deficiency, unspecified: Secondary | ICD-10-CM

## 2024-03-09 DIAGNOSIS — I10 Essential (primary) hypertension: Secondary | ICD-10-CM

## 2024-03-09 MED ORDER — AMLODIPINE BESYLATE 10 MG PO TABS
10.0000 mg | ORAL_TABLET | Freq: Every day | ORAL | 1 refills | Status: AC
  Filled 2024-03-09 (×2): qty 90, 90d supply, fill #0

## 2024-03-09 MED ORDER — CLOPIDOGREL BISULFATE 75 MG PO TABS
75.0000 mg | ORAL_TABLET | Freq: Every day | ORAL | 1 refills | Status: AC
  Filled 2024-03-09: qty 90, 90d supply, fill #0

## 2024-03-09 NOTE — Progress Notes (Signed)
 " [facility]  Lindsey Mercer, is a 64 y.o. female  RDW:[id number]  FMW:[id number]  DOB - [birth year]  Chief Complaint  Patient presents with   Hypertension   Hyperlipidemia       Subjective:   Lindsey Mercer is a 64 y.o. female here today for a follow up visit. Hypertension -history patient has No headache, No chest pain, No abdominal pain - No Nausea, No new weakness tingling or numbness, No Cough - shortness of breath.  Hyperlipidemia will check lipids. Medication refills. Hypertension  Hyperlipidemia    No problems updated.  Comprehensive ROS Pertinent positive and negative noted in HPI   Allergies[1]  Past Medical History:  Diagnosis Date   Allergy    Arthritis    DJD (degenerative joint disease)    Hypertension     Medications Ordered Prior to Encounter[2] Health Maintenance  Topic Date Due   Pap with HPV screening  Never done   Colon Cancer Screening  Never done   Breast Cancer Screening  11/30/2007   COVID-19 Vaccine (2 - 2025-26 season) 10/20/2023   Flu Shot  05/18/2024*   Zoster (Shingles) Vaccine (1 of 2) 06/07/2024*   DTaP/Tdap/Td vaccine (1 - Tdap) 03/09/2025*   Pneumococcal Vaccine for age over 29 (1 of 1 - PCV) 03/09/2025*   HPV Vaccine (No Doses Required) Completed   Hepatitis C Screening  Completed   HIV Screening  Completed   Hepatitis B Vaccine  Aged Out   Meningitis B Vaccine  Aged Out  *Topic was postponed. The date shown is not the original due date.    Objective:   Vitals:   03/09/24 1400 03/09/24 1406  BP: (!) 152/74 137/70  Pulse: 76   Resp: 16   SpO2: 100%   Weight: 196 lb (88.9 kg)   Height: 5' 5 (1.651 m)    BP Readings from Last 3 Encounters:  03/09/24 137/70  08/05/23 126/80  07/03/23 136/83      Physical Exam Vitals reviewed.  Constitutional:      Appearance: Normal appearance. She is obese.  HENT:     Head: Normocephalic.     Right Ear: Tympanic membrane, ear canal and external ear normal.      Left Ear: Tympanic membrane, ear canal and external ear normal.     Nose: Congestion present.     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  Eyes:     Extraocular Movements: Extraocular movements intact.     Pupils: Pupils are equal, round, and reactive to light.  Cardiovascular:     Rate and Rhythm: Normal rate and regular rhythm.  Pulmonary:     Effort: Pulmonary effort is normal.     Breath sounds: Normal breath sounds.  Abdominal:     General: Bowel sounds are normal.     Palpations: Abdomen is soft.  Musculoskeletal:        General: Normal range of motion.     Cervical back: Normal range of motion and neck supple.  Skin:    General: Skin is warm and dry.  Neurological:     Mental Status: She is alert and oriented to person, place, and time.  Psychiatric:        Mood and Affect: Mood normal.        Behavior: Behavior normal.        Thought Content: Thought content normal.      Assessment &  Plan  Zoria was seen today for hypertension and hyperlipidemia.  Diagnoses and all orders for this visit:  HYPERTENSION, BENIGN SYSTEMIC BP goal - < 140/90 Explained that having normal blood pressure is the goal and medications are helping to get to goal and maintain normal blood pressure. DIET: Limit salt intake, read nutrition labels to check salt content, limit fried and high fatty foods  Avoid using multisymptom OTC cold preparations that generally contain sudafed which can rise BP. Consult with pharmacist on best cold relief products to use for persons with HTN EXERCISE Discussed incorporating exercise such as walking - 30 minutes most days of the week and can do in 10 minute intervals    -     CBC with Differential/Platelet -     CMP14+EGFR -     amLODipine  (NORVASC ) 10 MG tablet; Take 1 tablet (10 mg total) by mouth daily.  HYPERCHOLESTEROLEMIA -     Lipid panel  Encounter for screening mammogram  for malignant neoplasm of breast -     MM 3D SCREENING MAMMOGRAM BILATERAL BREAST; Future  Colon cancer screening -     Fecal occult blood, imunochemical; Future  Anemia due to vitamin B12 deficiency, unspecified B12 deficiency type -     CBC with Differential/Platelet -     Vitamin B12  Vitamin D  deficiency -     VITAMIN D  25 Hydroxy (Vit-D Deficiency, Fractures)  Cerebrovascular accident (CVA), unspecified mechanism (HCC) -     clopidogrel  (PLAVIX ) 75 MG tablet; Take 1 tablet (75 mg total) by mouth daily.      Patient have been counseled extensively about nutrition and exercise. Other issues discussed during this visit include: low cholesterol diet, weight control and daily exercise, foot care, annual eye examinations at Ophthalmology, importance of adherence with medications and regular follow-up. We also discussed long term complications of uncontrolled diabetes and hypertension.   No follow-ups on file.  The patient was given clear instructions to go to ER or return to medical center if symptoms don't improve, worsen or new problems develop. The patient verbalized understanding. The patient was told to call to get lab results if they haven't heard anything in the next week.   This note has been created with Education officer, environmental. Any transcriptional errors are unintentional.   Rosaline SHAUNNA Bohr, NP 03/09/2024, 2:36 PM     [1]  Allergies Allergen Reactions   Aspirin Hives and Nausea Only   Lisinopril     REACTION: Dry cough   Penicillins     REACTION: hives   Sulfa Antibiotics Hives  [2]  Current Outpatient Medications on File Prior to Visit  Medication Sig Dispense Refill   atorvastatin  (LIPITOR ) 80 MG tablet Take 1 tablet (80 mg total) by mouth daily. 90 tablet 1   No current facility-administered medications on file prior to visit.   "

## 2024-03-10 LAB — LIPID PANEL

## 2024-03-11 ENCOUNTER — Other Ambulatory Visit (HOSPITAL_COMMUNITY): Payer: Self-pay

## 2024-03-11 LAB — CBC WITH DIFFERENTIAL/PLATELET
Basophils Absolute: 0 x10E3/uL (ref 0.0–0.2)
Basos: 1 %
EOS (ABSOLUTE): 0.2 x10E3/uL (ref 0.0–0.4)
Eos: 2 %
Hematocrit: 41.3 % (ref 34.0–46.6)
Hemoglobin: 12.8 g/dL (ref 11.1–15.9)
Immature Grans (Abs): 0 x10E3/uL (ref 0.0–0.1)
Immature Granulocytes: 0 %
Lymphocytes Absolute: 2.5 x10E3/uL (ref 0.7–3.1)
Lymphs: 35 %
MCH: 25.4 pg — ABNORMAL LOW (ref 26.6–33.0)
MCHC: 31 g/dL — ABNORMAL LOW (ref 31.5–35.7)
MCV: 82 fL (ref 79–97)
Monocytes Absolute: 0.6 x10E3/uL (ref 0.1–0.9)
Monocytes: 9 %
Neutrophils Absolute: 3.9 x10E3/uL (ref 1.4–7.0)
Neutrophils: 53 %
Platelets: 417 x10E3/uL (ref 150–450)
RBC: 5.04 x10E6/uL (ref 3.77–5.28)
RDW: 14.9 % (ref 11.7–15.4)
WBC: 7.2 x10E3/uL (ref 3.4–10.8)

## 2024-03-11 LAB — CMP14+EGFR
ALT: 23 IU/L (ref 0–32)
AST: 24 IU/L (ref 0–40)
Albumin: 4.4 g/dL (ref 3.9–4.9)
Alkaline Phosphatase: 126 IU/L (ref 49–135)
BUN/Creatinine Ratio: 18 (ref 12–28)
BUN: 18 mg/dL (ref 8–27)
Bilirubin Total: 0.2 mg/dL (ref 0.0–1.2)
CO2: 23 mmol/L (ref 20–29)
Calcium: 9.6 mg/dL (ref 8.7–10.3)
Chloride: 105 mmol/L (ref 96–106)
Creatinine, Ser: 1.02 mg/dL — AB (ref 0.57–1.00)
Globulin, Total: 3 g/dL (ref 1.5–4.5)
Glucose: 86 mg/dL (ref 70–99)
Potassium: 4.7 mmol/L (ref 3.5–5.2)
Sodium: 143 mmol/L (ref 134–144)
Total Protein: 7.4 g/dL (ref 6.0–8.5)
eGFR: 62 mL/min/1.73

## 2024-03-11 LAB — LIPID PANEL
Cholesterol, Total: 217 mg/dL — AB (ref 100–199)
HDL: 64 mg/dL
LDL CALC COMMENT:: 3.4 ratio (ref 0.0–4.4)
LDL Chol Calc (NIH): 126 mg/dL — AB (ref 0–99)
Triglycerides: 154 mg/dL — AB (ref 0–149)
VLDL Cholesterol Cal: 27 mg/dL (ref 5–40)

## 2024-03-11 LAB — VITAMIN B12: Vitamin B-12: 348 pg/mL (ref 232–1245)

## 2024-03-11 LAB — VITAMIN D 25 HYDROXY (VIT D DEFICIENCY, FRACTURES): Vit D, 25-Hydroxy: 11 ng/mL — ABNORMAL LOW (ref 30.0–100.0)

## 2024-03-12 ENCOUNTER — Ambulatory Visit (INDEPENDENT_AMBULATORY_CARE_PROVIDER_SITE_OTHER): Payer: Self-pay | Admitting: Primary Care

## 2024-03-12 ENCOUNTER — Other Ambulatory Visit (HOSPITAL_COMMUNITY): Payer: Self-pay

## 2024-03-12 DIAGNOSIS — E559 Vitamin D deficiency, unspecified: Secondary | ICD-10-CM

## 2024-03-12 DIAGNOSIS — I639 Cerebral infarction, unspecified: Secondary | ICD-10-CM

## 2024-03-12 MED ORDER — ERGOCALCIFEROL 1.25 MG (50000 UT) PO CAPS
50000.0000 [IU] | ORAL_CAPSULE | ORAL | 0 refills | Status: AC
  Filled 2024-03-12: qty 10, 70d supply, fill #0

## 2024-03-12 MED ORDER — ATORVASTATIN CALCIUM 80 MG PO TABS
80.0000 mg | ORAL_TABLET | Freq: Every day | ORAL | 1 refills | Status: AC
  Filled 2024-03-12: qty 90, 90d supply, fill #0

## 2024-03-18 ENCOUNTER — Ambulatory Visit: Payer: Self-pay

## 2024-03-24 ENCOUNTER — Other Ambulatory Visit (HOSPITAL_COMMUNITY): Payer: Self-pay

## 2024-06-07 ENCOUNTER — Ambulatory Visit (INDEPENDENT_AMBULATORY_CARE_PROVIDER_SITE_OTHER): Payer: Self-pay | Admitting: Primary Care
